# Patient Record
Sex: Male | Born: 1970 | Race: White | Hispanic: No | Marital: Married | Smoking: Never smoker
Health system: Southern US, Community
[De-identification: ages and names within clinical notes are randomized; demographics above are authoritative.]

## PROBLEM LIST (undated history)

## (undated) DIAGNOSIS — I10 Essential (primary) hypertension: Secondary | ICD-10-CM

## (undated) DIAGNOSIS — R002 Palpitations: Secondary | ICD-10-CM

## (undated) DIAGNOSIS — Z87442 Personal history of urinary calculi: Secondary | ICD-10-CM

## (undated) HISTORY — DX: Essential (primary) hypertension: I10

## (undated) HISTORY — DX: Personal history of urinary calculi: Z87.442

## (undated) HISTORY — PX: KIDNEY STONE SURGERY: SHX686

## (undated) HISTORY — DX: Palpitations: R00.2

## (undated) HISTORY — PX: COLONOSCOPY: SHX174

---

## 2004-01-12 ENCOUNTER — Ambulatory Visit (HOSPITAL_COMMUNITY): Admission: RE | Admit: 2004-01-12 | Discharge: 2004-01-12 | Payer: Self-pay | Admitting: Internal Medicine

## 2004-01-17 ENCOUNTER — Ambulatory Visit (HOSPITAL_COMMUNITY): Admission: RE | Admit: 2004-01-17 | Discharge: 2004-01-17 | Payer: Self-pay | Admitting: Internal Medicine

## 2004-01-23 ENCOUNTER — Ambulatory Visit (HOSPITAL_COMMUNITY): Admission: RE | Admit: 2004-01-23 | Discharge: 2004-01-23 | Payer: Self-pay | Admitting: Urology

## 2004-11-23 ENCOUNTER — Emergency Department (HOSPITAL_COMMUNITY): Admission: EM | Admit: 2004-11-23 | Discharge: 2004-11-23 | Payer: Self-pay | Admitting: Family Medicine

## 2005-06-11 ENCOUNTER — Emergency Department (HOSPITAL_COMMUNITY): Admission: EM | Admit: 2005-06-11 | Discharge: 2005-06-11 | Payer: Self-pay | Admitting: Family Medicine

## 2005-06-11 ENCOUNTER — Ambulatory Visit (HOSPITAL_COMMUNITY): Admission: RE | Admit: 2005-06-11 | Discharge: 2005-06-11 | Payer: Self-pay | Admitting: Family Medicine

## 2006-01-14 ENCOUNTER — Emergency Department (HOSPITAL_COMMUNITY): Admission: EM | Admit: 2006-01-14 | Discharge: 2006-01-14 | Payer: Self-pay | Admitting: Family Medicine

## 2008-07-02 ENCOUNTER — Inpatient Hospital Stay (HOSPITAL_COMMUNITY): Admission: EM | Admit: 2008-07-02 | Discharge: 2008-07-07 | Payer: Self-pay | Admitting: Emergency Medicine

## 2008-07-11 ENCOUNTER — Encounter: Admission: RE | Admit: 2008-07-11 | Discharge: 2008-07-11 | Payer: Self-pay | Admitting: Surgery

## 2010-06-27 LAB — URINALYSIS, ROUTINE W REFLEX MICROSCOPIC
Bilirubin Urine: NEGATIVE
Glucose, UA: NEGATIVE mg/dL
Ketones, ur: NEGATIVE mg/dL
Leukocytes, UA: NEGATIVE
Nitrite: NEGATIVE
Protein, ur: 100 mg/dL — AB
Specific Gravity, Urine: 1.024 (ref 1.005–1.030)
Urobilinogen, UA: 1 mg/dL (ref 0.0–1.0)
pH: 6 (ref 5.0–8.0)

## 2010-06-27 LAB — CBC
HCT: 38.8 % — ABNORMAL LOW (ref 39.0–52.0)
HCT: 39 % (ref 39.0–52.0)
HCT: 40.2 % (ref 39.0–52.0)
HCT: 41.1 % (ref 39.0–52.0)
HCT: 44 % (ref 39.0–52.0)
Hemoglobin: 13.4 g/dL (ref 13.0–17.0)
Hemoglobin: 13.5 g/dL (ref 13.0–17.0)
Hemoglobin: 13.8 g/dL (ref 13.0–17.0)
Hemoglobin: 14.3 g/dL (ref 13.0–17.0)
Hemoglobin: 15 g/dL (ref 13.0–17.0)
MCHC: 34.1 g/dL (ref 30.0–36.0)
MCHC: 34.2 g/dL (ref 30.0–36.0)
MCHC: 34.3 g/dL (ref 30.0–36.0)
MCHC: 34.7 g/dL (ref 30.0–36.0)
MCHC: 34.7 g/dL (ref 30.0–36.0)
MCV: 89.9 fL (ref 78.0–100.0)
MCV: 90.2 fL (ref 78.0–100.0)
MCV: 90.9 fL (ref 78.0–100.0)
MCV: 91.3 fL (ref 78.0–100.0)
MCV: 91.7 fL (ref 78.0–100.0)
Platelets: 128 10*3/uL — ABNORMAL LOW (ref 150–400)
Platelets: 155 10*3/uL (ref 150–400)
Platelets: 166 10*3/uL (ref 150–400)
Platelets: 192 10*3/uL (ref 150–400)
Platelets: 193 10*3/uL (ref 150–400)
RBC: 4.26 MIL/uL (ref 4.22–5.81)
RBC: 4.26 MIL/uL (ref 4.22–5.81)
RBC: 4.46 MIL/uL (ref 4.22–5.81)
RBC: 4.57 MIL/uL (ref 4.22–5.81)
RBC: 4.82 MIL/uL (ref 4.22–5.81)
RDW: 12.9 % (ref 11.5–15.5)
RDW: 12.9 % (ref 11.5–15.5)
RDW: 13 % (ref 11.5–15.5)
RDW: 13.1 % (ref 11.5–15.5)
RDW: 13.2 % (ref 11.5–15.5)
WBC: 11.6 10*3/uL — ABNORMAL HIGH (ref 4.0–10.5)
WBC: 14.5 10*3/uL — ABNORMAL HIGH (ref 4.0–10.5)
WBC: 8.6 10*3/uL (ref 4.0–10.5)
WBC: 9 10*3/uL (ref 4.0–10.5)
WBC: 9.4 10*3/uL (ref 4.0–10.5)

## 2010-06-27 LAB — DIFFERENTIAL
Basophils Absolute: 0 10*3/uL (ref 0.0–0.1)
Basophils Relative: 0 % (ref 0–1)
Eosinophils Absolute: 0.1 10*3/uL (ref 0.0–0.7)
Eosinophils Relative: 1 % (ref 0–5)
Lymphocytes Relative: 11 % — ABNORMAL LOW (ref 12–46)
Lymphs Abs: 1.5 10*3/uL (ref 0.7–4.0)
Monocytes Absolute: 1.5 10*3/uL — ABNORMAL HIGH (ref 0.1–1.0)
Monocytes Relative: 10 % (ref 3–12)
Neutro Abs: 11.3 10*3/uL — ABNORMAL HIGH (ref 1.7–7.7)
Neutrophils Relative %: 78 % — ABNORMAL HIGH (ref 43–77)

## 2010-06-27 LAB — URINE MICROSCOPIC-ADD ON

## 2010-06-27 LAB — COMPREHENSIVE METABOLIC PANEL
ALT: 32 U/L (ref 0–53)
AST: 29 U/L (ref 0–37)
Albumin: 3.8 g/dL (ref 3.5–5.2)
Alkaline Phosphatase: 71 U/L (ref 39–117)
BUN: 11 mg/dL (ref 6–23)
CO2: 24 mEq/L (ref 19–32)
Calcium: 8.8 mg/dL (ref 8.4–10.5)
Chloride: 104 mEq/L (ref 96–112)
Creatinine, Ser: 1.01 mg/dL (ref 0.4–1.5)
GFR calc Af Amer: >60 mL/min (ref >60)
GFR calc non Af Amer: >60 mL/min (ref >60)
Glucose, Bld: 108 mg/dL — ABNORMAL HIGH (ref 70–99)
Potassium: 3.5 mEq/L (ref 3.5–5.1)
Sodium: 135 mEq/L (ref 135–145)
Total Bilirubin: 2 mg/dL — ABNORMAL HIGH (ref 0.3–1.2)
Total Protein: 7.4 g/dL (ref 6.0–8.3)

## 2010-06-27 LAB — LIPASE, BLOOD: Lipase: 24 U/L (ref 11–59)

## 2010-07-31 NOTE — Discharge Summary (Signed)
[name], [name] NO.:  [id number]   MEDICAL RECORD NO.:  [id number]          PATIENT TYPE:  INP   LOCATION:  5157                         FACILITY:  [facility]   PHYSICIAN:  Ardeth Sportsman, MD     DATE OF BIRTH:  1970/10/18   DATE OF ADMISSION:  07/02/2008  DATE OF DISCHARGE:  07/07/2008                               DISCHARGE SUMMARY   CHIEF COMPLAINT AND REASON FOR ADMISSION:  Mr. [name] is a 40 year old  male patient who developed abdominal pain on Wednesday prior to  presentation presented with fevers and pain that was only focalized to  the right lower quadrant.  He had fever to 102 and a white count of  19,500.  I initially felt the patient may have acute appendicitis but  the CT scan revealed diverticulitis with inflammatory phlegmon and  microperforation of the cecal area with air focally without any  extravasation of CT contrast.  He was evaluated by Dr. Freida Busman in the ER.  The CT was reviewed and it was felt the patient had acute diverticulitis  without focal abscess collection, so plans were to admit the patient to  the hospital.   ADMITTING DIAGNOSIS:  Abdominal pain and leukocytosis secondary to acute  diverticulitis with associated microperforation.   HOSPITAL COURSE:  The patient was admitted, placed on bowel rest, IV  fluids, empiric Cipro and Flagyl and medications for symptom management.  His pain and symptoms were monitored serially as well as were his labs.  He eventually defervesced and his white count normalized by hospital day  #2.  He was started on clear liquids.  He remained on IV antibiotics.  His diet was advanced up to pureed diet by hospital day #3, but he began  having mild increase in pain more band-like all over associated more so  with eating but still present even without eating.  CT was checked  basically was stable showed the persistent phlegmon in the right lower  quadrant, slightly larger air collection but again no  extravasation of  CT contrast into this air collection or into the pelvis.  Subsequently,  the patient's diet was advanced to low residue.  He was switched over to  oral pain medications.  His pain was managed in addition with IV  Toradol.  By date of discharge, his white count remained normal at 9400  with a hemoglobin of 13.8, vital signs were stable.  He was having some  problems with mild hypertension during this hospitalization but was also  having pain and therefore I recommended once he is used to the initial  acute phase of this process follow up with his primary care physician  regarding evaluation for a new diagnosis of hypertension.  Getting back  to his abdominal problems on clinical exam on date of discharge his  abdomen was soft and nontender including in the right lower quadrant.  Bowel sounds were present.  He was tolerating low-residue diet with use  of oral Percocet and IV Toradol for managing pain.  The patient plans on  using p.r.n. ibuprofen at home.  He  has had some reflux while here.  He  will take Zantac.  He will take ibuprofen with food as he does need to  take this medication and has also been instructed he may need to add  Prilosec to that regimen and if he still has symptoms, he is to stop  attempting to use ibuprofen.   FINAL DISCHARGE DIAGNOSES:  1. Abdominal pain and leukocytosis secondary to acute perforate      diverticulitis.  No abscess, phlegmon only.  2. Borderline hypertension.  3. Gastroesophageal reflux disease.   DISCHARGE MEDICATIONS:  The patient will resume the following home  medications.  1. Claritin as needed.  2. Over-the-counter Zantac as needed.   NEW MEDICATIONS:  1. Percocet 5/325 1-2 tablets every 4 hours as needed for pain.  2. Cipro 500 mg b.i.d. for 7 days.  3. Flagyl 500 mg three times daily for 7 days.  4. Over-the-counter ibuprofen with food in addition to Percocet as      needed.  5. Over-the-counter Prilosec if you are  taking the ibuprofen.   OTHER RECOMMENDATIONS:  The patient is to return to work in 2 weeks.  A  note has been given.  He may return sooner if he is not taking narcotic  pain medications, otherwise feels up to returning to work.   FOLLOW UP:  He needs to see Dr. Corliss Skains in the office on July 15, 2008 at  9:30 a.m.  He is to arrive at 9:15.  He can discuss return to work  sooner than 2 weeks at this point.  In addition, I have discussed with  the patient he will probably need a screening colonoscopy and this will  probably be discussed in an additional followup with Dr. Corliss Skains.  Followup CT the patient had two CTs while in the hospital.  He was  afebrile and without pain and normal white count on date of discharge.  Defer any additional CTs to Dr. Fatima Sanger discretion.   OTHER ACTIVITY:  Essentially no restrictions on activity other than no  driving for 1 week while taking narcotics.   OTHER INSTRUCTIONS:  He is to call the surgeon if a fever greater than  or equal to 101.5 degrees Fahrenheit be new or increased belly pain.   DIET:  Again he is to be on a low-residue diet for 2 weeks and he may  resume a high fiber diet specific to diverticular disease.   Expect the patient will probably undergo segmental elective colectomy in  the next 6-8 weeks per Dr. Fatima Sanger discretion and recommendations.      Gabriel L. Eliott Nine, MD  Electronically Signed    ALE/MEDQ  D:  07/07/2008  T:  07/07/2008  Job:  [id number]   cc:   Wilmon Arms. Tsuei, M.D.

## 2010-07-31 NOTE — H&P (Signed)
NAMEROYALTY, DOMAGALA                 ACCOUNT NO.:  [id number]   MEDICAL RECORD NO.:  [id number]          PATIENT TYPE:  INP   LOCATION:  5120                         FACILITY:  [facility]   PHYSICIAN:  Lennie Muckle, MD      DATE OF BIRTH:  April 15, 1970   DATE OF ADMISSION:  07/02/2008  DATE OF DISCHARGE:                              HISTORY & PHYSICAL   DIAGNOSIS:  Diverticulitis.   CHIEF COMPLAINT:  Abdominal pain.   HISTORY OF PRESENT ILLNESS:  Mr. Trim is a 40 year old male who had  onset of abdominal pain on Wednesday.  He said it was a dull aching  pain.  It was located in the right lower quadrant.  He had had some mild  nausea and anorexia.  No frank emesis.  He did have fevers to 102.  The  pain then worsened, today it is worse.  It is worse with movement.  He  complained of pain on the car ride over.  No diarrhea.  He has  constipation.  No previous history of abdominal pain.  He has a white  count of 19.5.  He does have a CT scan revealing diverticulitis,  inflammation, and microperforation around the cecal area.  He feels  somewhat better after receiving narcotics.   PAST MEDICAL HISTORY:  He does have a history of hypertension.   SURGICAL HISTORY:  Negative.   FAMILY HISTORY:  Negative.   SOCIAL HISTORY:  Negative.   ALLERGIES:  No allergies.   MEDICATIONS:  No current medications.   He does work.  He is a Engineer, agricultural.   REVIEW OF SYSTEMS:  Other than the hypertension, there is no significant  medical history.   PHYSICAL EXAMINATION:  VITAL SIGNS:  His T-max was 101 at emergency  department, current 98.5, pulse 91, and blood pressure 147/105.  GENERAL:  Overall, he is lying on the stretcher in no acute distress  HEENT:  Normocephalic.  Sclerae are clear.  CHEST:  Clear to auscultation bilaterally.  CARDIOVASCULAR:  Regular rate and rhythm.  ABDOMEN:  Mildly distended.  He is tender in the right lower quadrant.  MUSCULOSKELETAL:  No deformities or edema seen.  SKIN:  No rashes are seen.   CT is reviewed.  There are some inflammatory changes around the cecal  and small pockets of air.   IMPRESSION AND PLAN:  Diverticulitis of the cecal, admit, intravenous  antibiotics, n.p.o., bowel rest, continue to monitor for any significant  change.  I have talked to him about hopefully electively performing  letting his inflammation resolve, perform by endoscopy for further  evaluation in approximately 6 weeks; however, if he does change  significantly would require emergent resection.  All questions were  answered.      Lennie Muckle, MD  Electronically Signed     ALA/MEDQ  D:  07/02/2008  T:  07/03/2008  Job:  [id number]

## 2010-09-28 ENCOUNTER — Ambulatory Visit: Payer: Self-pay

## 2010-09-28 ENCOUNTER — Other Ambulatory Visit: Payer: Self-pay | Admitting: Occupational Medicine

## 2010-09-28 DIAGNOSIS — R52 Pain, unspecified: Secondary | ICD-10-CM

## 2012-02-24 IMAGING — CR DG KNEE COMPLETE 4+V*R*
4 series · 4 of 4 positions shown · non-contrast
Comparison: 06/11/2005.

CLINICAL DATA: 39-year-old male with posterior right knee joint
pain.  Hyperextension injury.

RIGHT KNEE - COMPLETE 4+ VIEW

[view not recorded (1 of 4)]
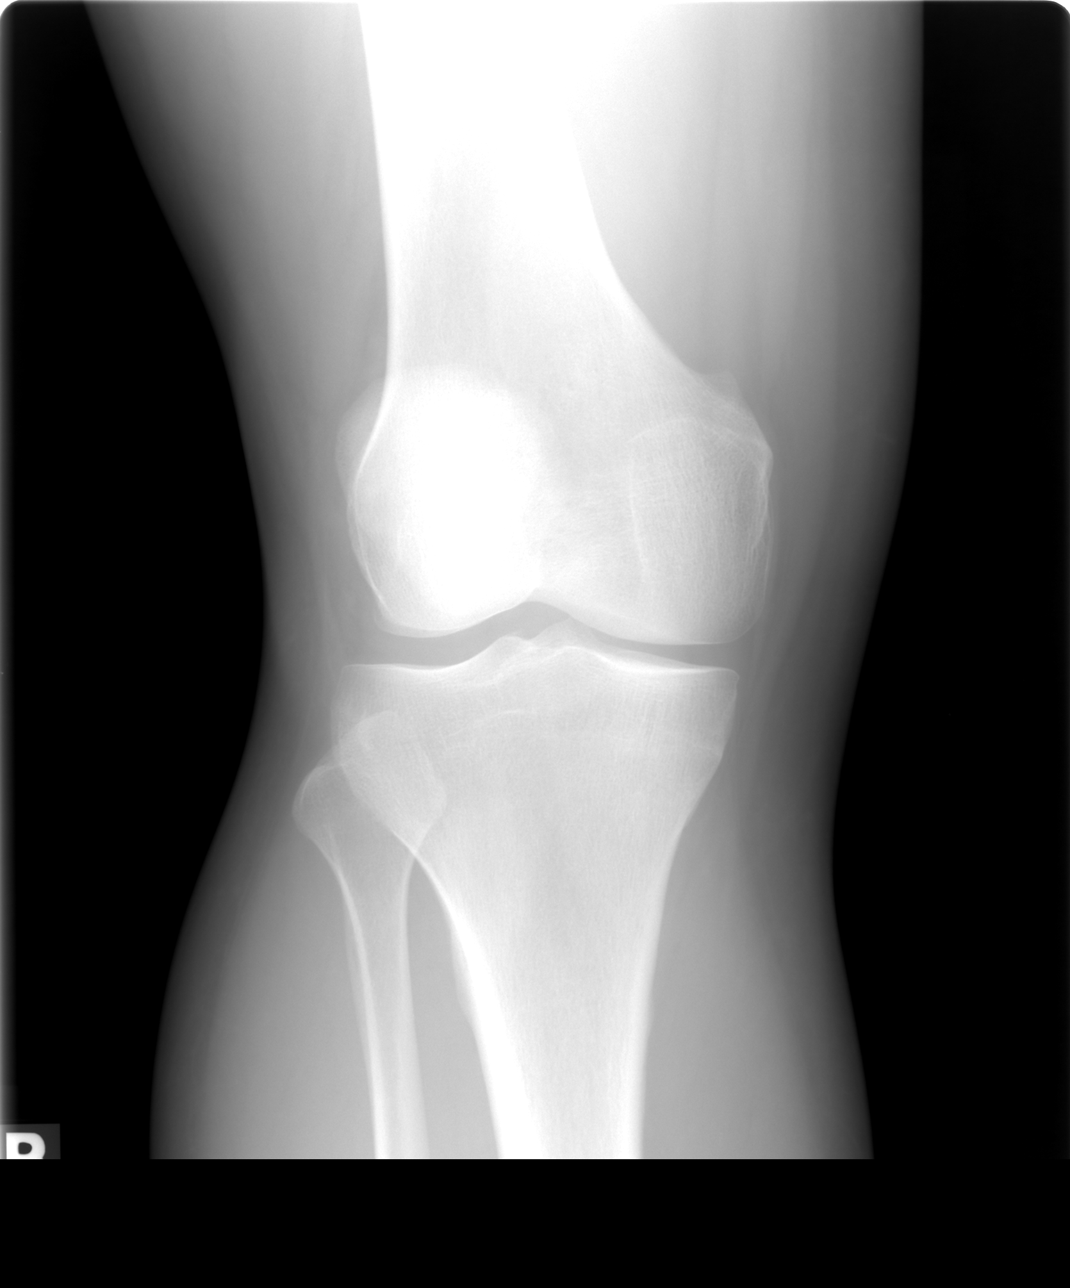

[view not recorded (2 of 4)]
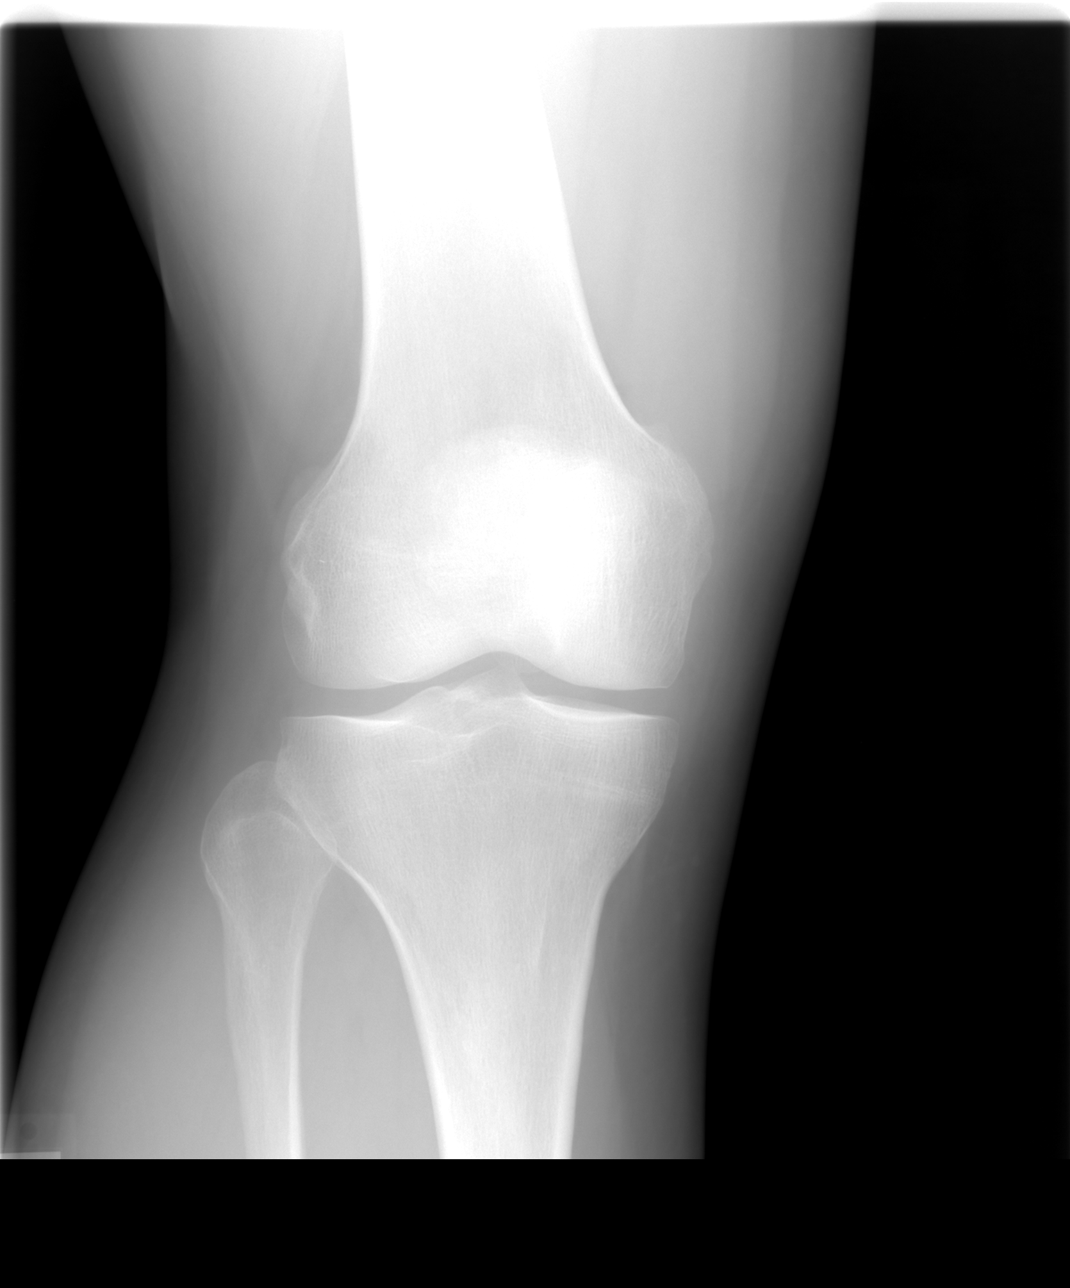

[view not recorded (3 of 4)]
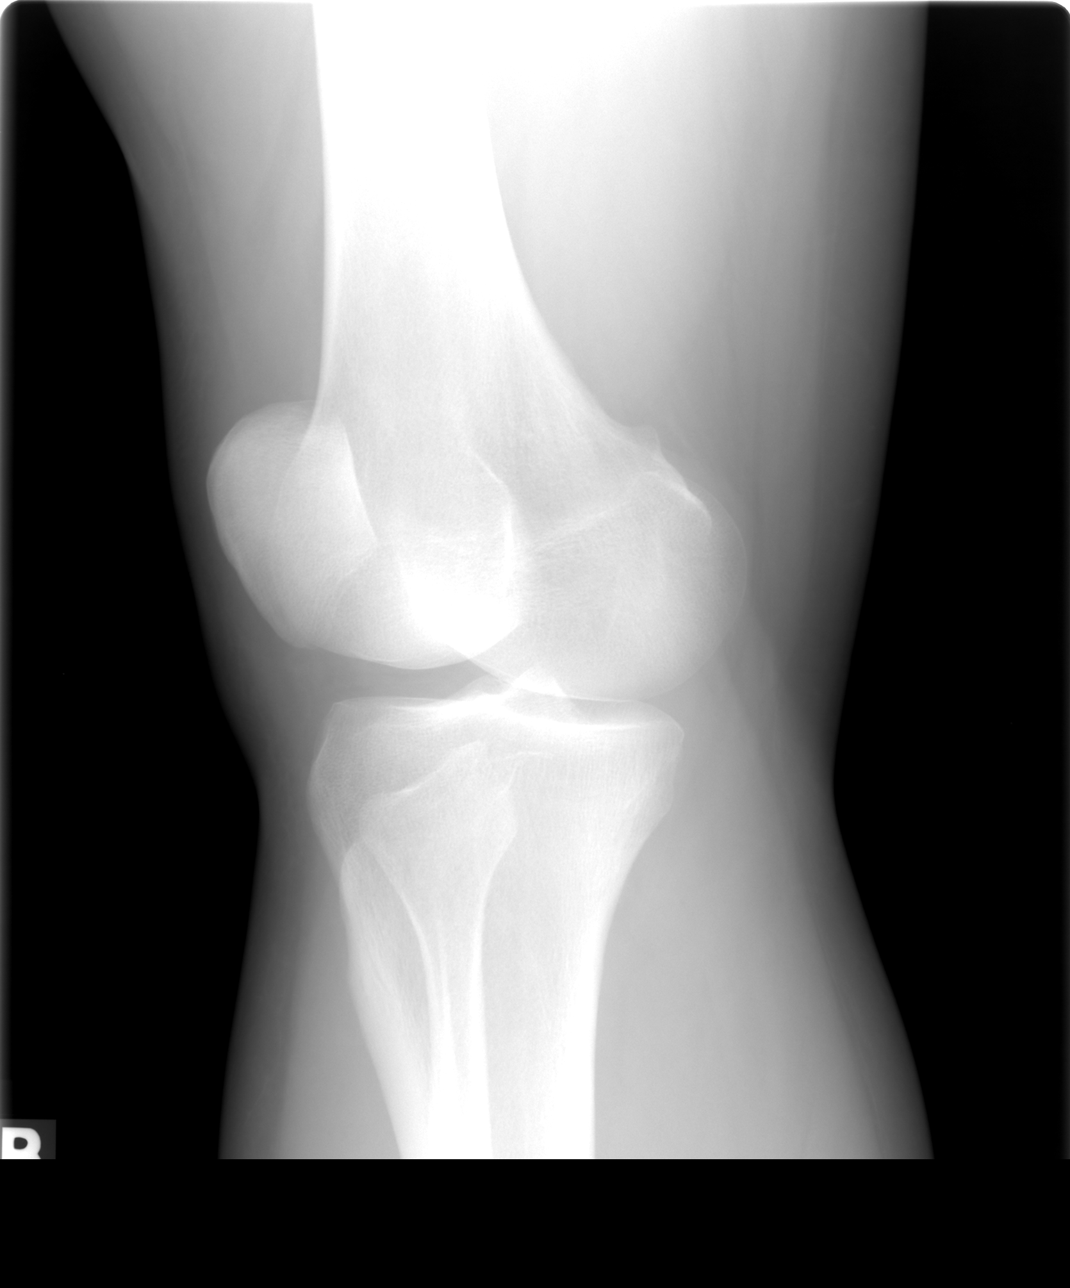

[view not recorded (4 of 4)]
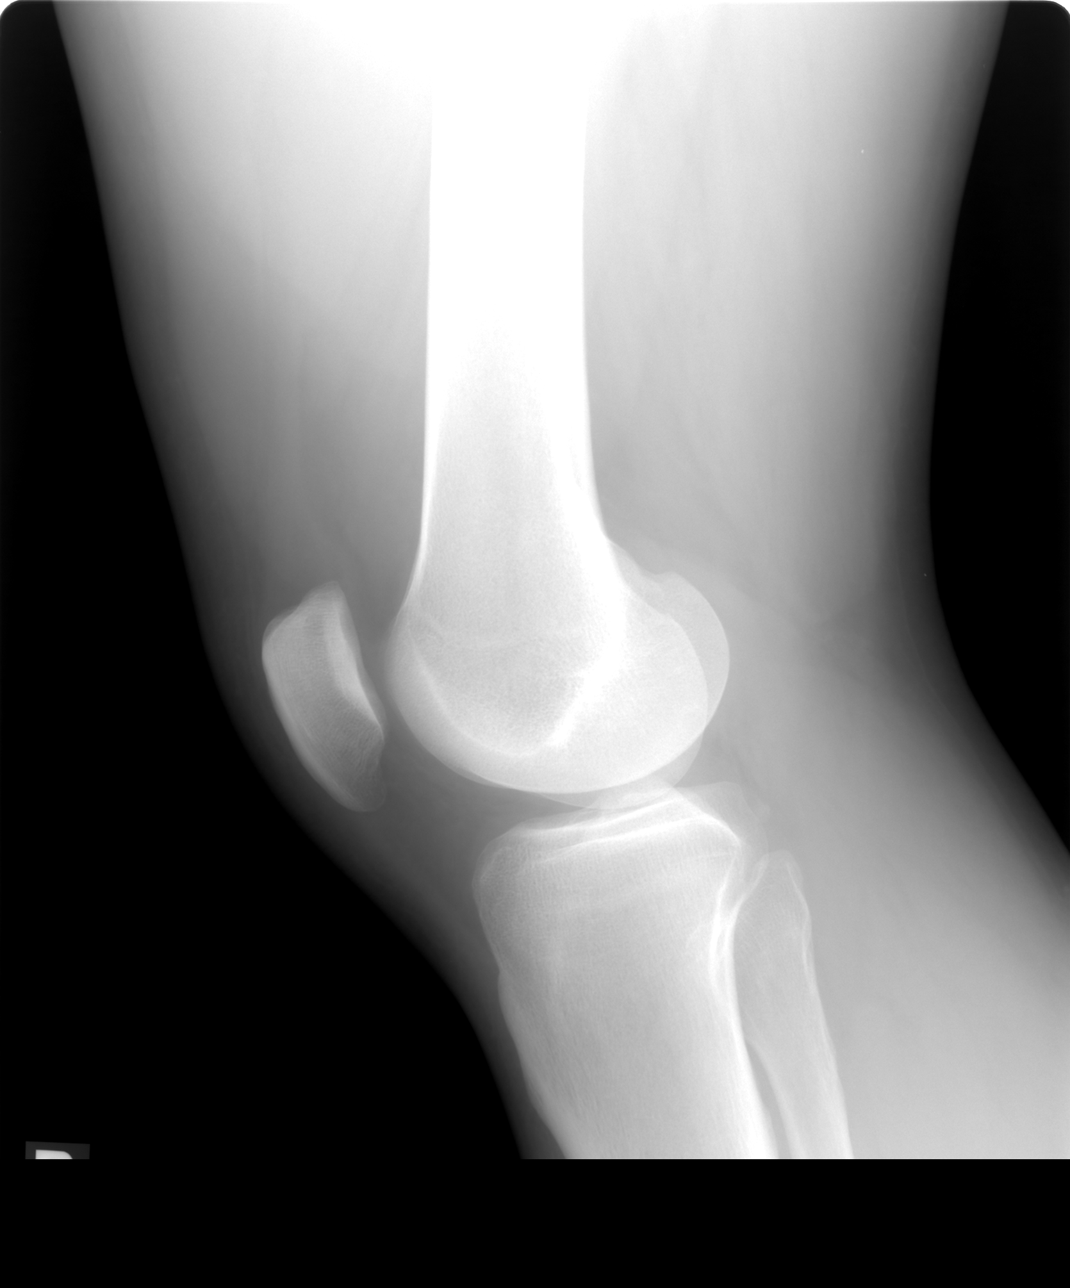

[4 of 4 positions shown; findings below may reference images not displayed]

FINDINGS: Probable trace joint effusion.  Normal bone
mineralization and joint spaces.  No acute fracture.
IMPRESSION: Suspect small joint effusion.  No acute fracture or dislocation
identified.

## 2016-07-31 DIAGNOSIS — R0781 Pleurodynia: Secondary | ICD-10-CM | POA: Diagnosis not present

## 2017-04-07 DIAGNOSIS — I10 Essential (primary) hypertension: Secondary | ICD-10-CM | POA: Diagnosis not present

## 2017-06-16 DIAGNOSIS — Z0000 Encounter for general adult medical examination without abnormal findings: Secondary | ICD-10-CM | POA: Diagnosis not present

## 2017-06-16 DIAGNOSIS — I10 Essential (primary) hypertension: Secondary | ICD-10-CM | POA: Diagnosis not present

## 2017-06-19 DIAGNOSIS — H109 Unspecified conjunctivitis: Secondary | ICD-10-CM | POA: Diagnosis not present

## 2018-01-27 DIAGNOSIS — I10 Essential (primary) hypertension: Secondary | ICD-10-CM | POA: Diagnosis not present

## 2018-01-27 DIAGNOSIS — Z23 Encounter for immunization: Secondary | ICD-10-CM | POA: Diagnosis not present

## 2018-01-27 DIAGNOSIS — E669 Obesity, unspecified: Secondary | ICD-10-CM | POA: Diagnosis not present

## 2019-07-27 ENCOUNTER — Ambulatory Visit: Payer: Self-pay | Attending: Internal Medicine

## 2019-07-27 DIAGNOSIS — Z23 Encounter for immunization: Secondary | ICD-10-CM

## 2019-07-27 NOTE — Progress Notes (Signed)
   Covid-19 Vaccination Clinic  Name:  [name]    MRN: [id number] DOB: Mar 16, 1971  07/27/2019  [name] was observed post Covid-19 immunization for 15 minutes without incident. He was provided with Vaccine Information Sheet and instruction to access the V-Safe system.   [name] was instructed to call 911 with any severe reactions post vaccine: Marland Kitchen Difficulty breathing  . Swelling of face and throat  . A fast heartbeat  . A bad rash all over body  . Dizziness and weakness   Immunizations Administered    Name Date Dose VIS Date Route   Pfizer COVID-19 Vaccine 07/27/2019  8:27 AM 0.3 mL 05/12/2018 Intramuscular   Manufacturer: [employer], [employer]   Lot: [id number]   NDC: [address]-8

## 2019-08-17 ENCOUNTER — Ambulatory Visit: Payer: Self-pay | Attending: Internal Medicine

## 2019-08-17 DIAGNOSIS — Z23 Encounter for immunization: Secondary | ICD-10-CM

## 2019-08-17 NOTE — Progress Notes (Signed)
   Covid-19 Vaccination Clinic  Name:  ISMAEL KARGE    MRN: [id number] DOB: 1970/09/29  08/17/2019  Mr. Cheetham was observed post Covid-19 immunization for 15 minutes without incident. He was provided with Vaccine Information Sheet and instruction to access the V-Safe system.   Mr. Pavich was instructed to call 911 with any severe reactions post vaccine: Marland Kitchen Difficulty breathing  . Swelling of face and throat  . A fast heartbeat  . A bad rash all over body  . Dizziness and weakness   Immunizations Administered    Name Date Dose VIS Date Route   Pfizer COVID-19 Vaccine 08/17/2019  9:13 AM 0.3 mL 05/12/2018 Intramuscular   Manufacturer: [employer], [employer]   Lot: [id number]   NDC: [address]-2

## 2022-06-21 ENCOUNTER — Ambulatory Visit
Admission: RE | Admit: 2022-06-21 | Discharge: 2022-06-21 | Disposition: A | Discharge status: Home / Self Care | Payer: 59 | Source: Ambulatory Visit | Attending: Physician Assistant | Admitting: Physician Assistant

## 2022-06-21 ENCOUNTER — Other Ambulatory Visit: Payer: Self-pay | Admitting: Physician Assistant

## 2022-06-21 DIAGNOSIS — L03211 Cellulitis of face: Secondary | ICD-10-CM

## 2022-06-21 MED ORDER — IOPAMIDOL (ISOVUE-300) INJECTION 61%
75.0000 mL | Freq: Once | INTRAVENOUS | Status: AC | PRN
  Administered 2022-06-21: 75 mL via INTRAVENOUS

## 2022-07-19 ENCOUNTER — Encounter (HOSPITAL_BASED_OUTPATIENT_CLINIC_OR_DEPARTMENT_OTHER): Payer: Self-pay

## 2022-07-19 ENCOUNTER — Other Ambulatory Visit: Payer: Self-pay

## 2022-07-19 ENCOUNTER — Emergency Department (HOSPITAL_BASED_OUTPATIENT_CLINIC_OR_DEPARTMENT_OTHER)
Admission: EM | Admit: 2022-07-19 | Discharge: 2022-07-19 | Disposition: A | Discharge status: Home / Self Care | Payer: 59 | Attending: Emergency Medicine | Admitting: Emergency Medicine

## 2022-07-19 ENCOUNTER — Emergency Department (HOSPITAL_BASED_OUTPATIENT_CLINIC_OR_DEPARTMENT_OTHER): Payer: 59 | Admitting: Radiology

## 2022-07-19 DIAGNOSIS — R002 Palpitations: Secondary | ICD-10-CM | POA: Insufficient documentation

## 2022-07-19 LAB — CBC
HCT: 47.9 % (ref 39.0–52.0)
Hemoglobin: 16.4 g/dL (ref 13.0–17.0)
MCH: 30.4 pg (ref 26.0–34.0)
MCHC: 34.2 g/dL (ref 30.0–36.0)
MCV: 88.7 fL (ref 80.0–100.0)
Platelets: 208 10*3/uL (ref 150–400)
RBC: 5.4 MIL/uL (ref 4.22–5.81)
RDW: 13 % (ref 11.5–15.5)
WBC: 7.3 10*3/uL (ref 4.0–10.5)
nRBC: 0 % (ref 0.0–0.2)

## 2022-07-19 LAB — BASIC METABOLIC PANEL
Anion gap: 12 (ref 5–15)
BUN: 19 mg/dL (ref 6–20)
CO2: 27 mmol/L (ref 22–32)
Calcium: 10 mg/dL (ref 8.9–10.3)
Chloride: 104 mmol/L (ref 98–111)
Creatinine, Ser: 1 mg/dL (ref 0.61–1.24)
GFR, Estimated: >60 mL/min (ref >60)
Glucose, Bld: 103 mg/dL — ABNORMAL HIGH (ref 70–99)
Potassium: 4 mmol/L (ref 3.5–5.1)
Sodium: 143 mmol/L (ref 135–145)

## 2022-07-19 LAB — TSH: TSH: 0.509 u[IU]/mL (ref 0.350–4.500)

## 2022-07-19 LAB — TROPONIN I (HIGH SENSITIVITY)
Troponin I (High Sensitivity): 3 ng/L (ref <18)
Troponin I (High Sensitivity): 3 ng/L (ref <18)

## 2022-07-19 NOTE — ED Triage Notes (Signed)
Patient arrives with complaints of intermittent palpitations x3 days and having an Abnormal EKG. He was seen at the [facility] and referred here for further evaluation.  Patient states that he does have chest soreness as well.

## 2022-07-19 NOTE — Discharge Instructions (Signed)
Your evaluation today did not show any concerning finding.  The cardiology office will reach out to you to set up an outpatient follow-up for further assessment.  Return to ER if you have any concern.

## 2022-07-19 NOTE — ED Provider Notes (Signed)
[place] EMERGENCY DEPARTMENT AT [facility] Provider Note   CSN: [id number] Arrival date & time: 07/19/22  1758     History  Chief Complaint  Patient presents with   Palpitations   Tachycardia    [name] is a 52 y.o. male.  The history is provided by the patient and medical records. No language interpreter was used.  Palpitations    52 year old male presented to ED for evaluation of heart palpitation.  Patient reports she has been experiencing intermittent episodes of heart palpitation for the past week.  Described as heart racing sometimes waking up from sleep which concerns him.  States that he has been monitoring his diet very carefully, have been losing weight due to good dietary intake, he has been exercise daily walk at least 2 or 3 miles a day and denies any associated exertional chest pain or shortness of breath.  He does endorse strong family history of heart problems and family history of A-fib.  Patient denies alcohol or tobacco use and states that he is compliant with his medication at home.  He denies any prior history of PE or DVT no recent surgery prolonged bedrest active cancer hemoptysis leg swelling or calf pain.  He does not take an oral birth control pill.  Today he did experiencing some heart palpitation again and went to his PCP to be evaluated.  They noted that his EKG is slightly abnormal and recommend patient come to the ER for further assessment.    Home Medications Prior to Admission medications   Not on File      Allergies    Hydromorphone    Review of Systems   Review of Systems  Cardiovascular:  Positive for palpitations.  All other systems reviewed and are negative.   Physical Exam Updated Vital Signs BP (!) 149/111   Pulse 85   Temp 97.9 F (36.6 C) (Oral)   Resp 13   Ht 5\' 6"  (1.676 m)   Wt 104.8 kg   SpO2 99%   BMI 37.28 kg/m  Physical Exam Vitals and nursing note reviewed.  Constitutional:      General: He is  not in acute distress.    Appearance: He is well-developed.  HENT:     Head: Atraumatic.  Eyes:     Conjunctiva/sclera: Conjunctivae normal.  Neck:     Comments: No thyromegaly Cardiovascular:     Rate and Rhythm: Normal rate and regular rhythm.     Pulses: Normal pulses.     Heart sounds: Normal heart sounds.  Pulmonary:     Effort: Pulmonary effort is normal.     Breath sounds: Normal breath sounds. No wheezing, rhonchi or rales.  Abdominal:     Palpations: Abdomen is soft.     Tenderness: There is no abdominal tenderness.  Musculoskeletal:     Cervical back: Neck supple.     Right lower leg: No edema.     Left lower leg: No edema.  Skin:    Findings: No rash.  Neurological:     Mental Status: He is alert.     ED Results / Procedures / Treatments   Labs (all labs ordered are listed, but only abnormal results are displayed) Labs Reviewed  BASIC METABOLIC PANEL - Abnormal; Notable for the following components:      Result Value   Glucose, Bld 103 (*)    All other components within normal limits  CBC  TSH  TROPONIN I (HIGH SENSITIVITY)  TROPONIN I (  HIGH SENSITIVITY)    EKG EKG Interpretation  Date/Time:  Friday Jul 19 2022 18:51:13 EDT Ventricular Rate:  96 PR Interval:  160 QRS Duration: 80 QT Interval:  338 QTC Calculation: 427 R Axis:   12 Text Interpretation: Normal sinus rhythm Possible Left atrial enlargement Possible Anterior infarct , age undetermined Abnormal ECG No old tracing to compare Confirmed by Melene Plan [phone]) on 07/19/2022 8:46:56 PM  Radiology DG Chest 2 View  Result Date: 07/19/2022 CLINICAL DATA:  Palpitations EXAM: CHEST - 2 VIEW COMPARISON:  None Available. FINDINGS: The heart size and mediastinal contours are within normal limits. Both lungs are clear. The visualized skeletal structures are unremarkable. IMPRESSION: No active cardiopulmonary disease. Electronically Signed   By: Helyn Numbers M.D.   On: 07/19/2022 19:34     Procedures Procedures    Medications Ordered in ED Medications - No data to display  ED Course/ Medical Decision Making/ A&P                             Medical Decision Making Amount and/or Complexity of Data Reviewed Labs: ordered. Radiology: ordered.   BP (!) 149/111   Pulse 85   Temp 97.9 F (36.6 C) (Oral)   Resp 13   Ht 5\' 6"  (1.676 m)   Wt 104.8 kg   SpO2 99%   BMI 37.28 kg/m   38:49 PM  52 year old male presented to ED for evaluation of heart palpitation.  Patient reports she has been experiencing intermittent episodes of heart palpitation for the past week.  Described as heart racing sometimes waking up from sleep which concerns him.  States that he has been monitoring his diet very carefully, have been losing weight due to good dietary intake, he has been exercise daily walk at least 2 or 3 miles a day and denies any associated exertional chest pain or shortness of breath.  He does endorse strong family history of heart problems and family history of A-fib.  Patient denies alcohol or tobacco use and states that he is compliant with his medication at home.  He denies any prior history of PE or DVT no recent surgery prolonged bedrest active cancer hemoptysis leg swelling or calf pain.  He does not take an oral birth control pill.  Today he did experiencing some heart palpitation again and went to his PCP to be evaluated.  They noted that his EKG is slightly abnormal and recommend patient come to the ER for further assessment.  On exam this is a well-appearing male resting comfortably appears to be in no acute discomfort heart with normal rate and rhythm lungs are clear to auscultation bilaterally abdomen is soft nontender, neck exam unremarkable no evidence of thyromegaly.  Patient does not have any signs of peripheral edema.  He has intact distal pulses throughout.  -Labs ordered, independently viewed and interpreted by me.  Labs remarkable for negative delta troponin,  low suspicion for ACS.  Patient has a heart score of 2, low risk of Mace.  Normal TSH, doubt thyroid illness.  Labs overall reassuring no evidence of anemia normal WBC, normal H&H, normal electrolyte panel. -The patient was maintained on a cardiac monitor.  I personally viewed and interpreted the cardiac monitored which showed an underlying rhythm of: Normal sinus rhythm -Imaging independently viewed and interpreted by me and I agree with radiologist's interpretation.  Result remarkable for chest x-ray without any concerning finding -This patient presents to the ED  for concern of heart palpitation, this involves an extensive number of treatment options, and is a complaint that carries with it a high risk of complications and morbidity.  The differential diagnosis includes thyroid disease, anemia, electrolyte imbalance, dehydration, cardiac arrhythmia, PE -Co morbidities that complicate the patient evaluation includes none -Treatment includes close monitoring -Reevaluation of the patient after these medicines showed that the patient improved -PCP office notes or outside notes reviewed -Escalation to admission/observation considered: patients feels much better, is comfortable with discharge, and will follow up with PCP and with cardiology -Prescription medication considered, patient comfortable with over-the-counter medication -Social Determinant of Health considered  Will give patient referral to cardiology for outpatient evaluation.  He may benefit from cardiac monitoring to assess for his heart palpitation.         Final Clinical Impression(s) / ED Diagnoses Final diagnoses:  Heart palpitations    Rx / DC Orders ED Discharge Orders          Ordered    Ambulatory referral to Cardiology       Comments: If you have not heard from the Cardiology office within the next 72 hours please call [phone].   07/19/22 2317              Fayrene Helper, PA-C 07/19/22 2318    Melene Plan, DO 07/19/22 2333

## 2022-07-20 ENCOUNTER — Emergency Department (HOSPITAL_COMMUNITY): Payer: 59

## 2022-07-20 ENCOUNTER — Other Ambulatory Visit: Payer: Self-pay

## 2022-07-20 ENCOUNTER — Encounter (HOSPITAL_COMMUNITY): Payer: Self-pay

## 2022-07-20 ENCOUNTER — Emergency Department (HOSPITAL_COMMUNITY)
Admission: EM | Admit: 2022-07-20 | Discharge: 2022-07-20 | Disposition: A | Discharge status: Home / Self Care | Payer: 59 | Attending: Emergency Medicine | Admitting: Emergency Medicine

## 2022-07-20 DIAGNOSIS — R009 Unspecified abnormalities of heart beat: Secondary | ICD-10-CM | POA: Insufficient documentation

## 2022-07-20 DIAGNOSIS — R002 Palpitations: Secondary | ICD-10-CM | POA: Insufficient documentation

## 2022-07-20 DIAGNOSIS — R0602 Shortness of breath: Secondary | ICD-10-CM | POA: Insufficient documentation

## 2022-07-20 DIAGNOSIS — R000 Tachycardia, unspecified: Secondary | ICD-10-CM

## 2022-07-20 DIAGNOSIS — R17 Unspecified jaundice: Secondary | ICD-10-CM | POA: Diagnosis not present

## 2022-07-20 LAB — HEPATIC FUNCTION PANEL
ALT: 118 U/L — ABNORMAL HIGH (ref 0–44)
AST: 34 U/L (ref 15–41)
Albumin: 4.3 g/dL (ref 3.5–5.0)
Alkaline Phosphatase: 85 U/L (ref 38–126)
Bilirubin, Direct: 0.3 mg/dL — ABNORMAL HIGH (ref 0.0–0.2)
Indirect Bilirubin: 1.7 mg/dL — ABNORMAL HIGH (ref 0.3–0.9)
Total Bilirubin: 2 mg/dL — ABNORMAL HIGH (ref 0.3–1.2)
Total Protein: 7.4 g/dL (ref 6.5–8.1)

## 2022-07-20 LAB — BASIC METABOLIC PANEL
Anion gap: 15 (ref 5–15)
BUN: 18 mg/dL (ref 6–20)
CO2: 23 mmol/L (ref 22–32)
Calcium: 10 mg/dL (ref 8.9–10.3)
Chloride: 103 mmol/L (ref 98–111)
Creatinine, Ser: 1.11 mg/dL (ref 0.61–1.24)
GFR, Estimated: >60 mL/min (ref >60)
Glucose, Bld: 112 mg/dL — ABNORMAL HIGH (ref 70–99)
Potassium: 3.8 mmol/L (ref 3.5–5.1)
Sodium: 141 mmol/L (ref 135–145)

## 2022-07-20 LAB — CBC
HCT: 47 % (ref 39.0–52.0)
Hemoglobin: 16.6 g/dL (ref 13.0–17.0)
MCH: 30.8 pg (ref 26.0–34.0)
MCHC: 35.3 g/dL (ref 30.0–36.0)
MCV: 87.2 fL (ref 80.0–100.0)
Platelets: 227 10*3/uL (ref 150–400)
RBC: 5.39 MIL/uL (ref 4.22–5.81)
RDW: 12.8 % (ref 11.5–15.5)
WBC: 7.6 10*3/uL (ref 4.0–10.5)
nRBC: 0 % (ref 0.0–0.2)

## 2022-07-20 LAB — D-DIMER, QUANTITATIVE: D-Dimer, Quant: 0.28 ug/mL-FEU (ref 0.00–0.50)

## 2022-07-20 LAB — TROPONIN I (HIGH SENSITIVITY)
Troponin I (High Sensitivity): 4 ng/L (ref <18)
Troponin I (High Sensitivity): 4 ng/L (ref <18)

## 2022-07-20 NOTE — ED Triage Notes (Signed)
Pt came in via POV d/t the last few days his heart racing & feeling SOB. Pt reports his BP had been controlled until lately he was feeling very lightheaded & dizzy as well as nauseated. Was seen at [facility] clinic & was advised to be seen, he went to [facility] last night & came back today d/t exertional SOB causing sweating, lightheaded & some intermittent CP in the Lt side that radiates to his back intermittently. A/Ox4, rates pain 3/10 while in triage.

## 2022-07-20 NOTE — Discharge Instructions (Addendum)
Your workup today shows no significant abnormalities.  Your bilirubin was slightly elevated.  You will need to follow-up with your primary care doctor regarding these findings. There is no evidence that you have a heart attack, problems with your thyroid, a blood clot in your chest or any other life-threatening causes of the symptoms you experienced this morning. Is extremely important that you follow-up with cardiology to further assess the reason that you may be having these symptoms. Further workup may include things like a cardiac calcium CT, stress testing, or cardiac monitoring. If you have the symptoms again it is important to try and capture the rhythm on an EKG.  It may benefit you to call 911.  It does not necessarily mean that you have to come to the emergency department however EMS is able to do an EKG at your home and assess you. Regarding your blood pressure please only take your blood pressure once a day.  You should take this at a time that you feel relaxed and at least an hour after you have taken your daily blood pressure medication. It is more important that when you are having these abnormal symptoms you take your heart rate. Lastly you may discuss with your primary care doctor or whoever is prescribing your blood pressure medications a switch in medication from what you are currently taking to a beta-blocker.  This may help reduce the symptoms you are having of racing heart and palpitations.  You may also need further evaluation with your primary care doctor for more extensive thyroid testing although your initial screening labs are negative here.   SEEK IMMEDIATE MEDICAL ATTENTION IF: You have severe chest pain, especially if the pain is crushing or pressure-like and spreads to the arms, back, neck, or jaw, or if you have sweating, nausea (feeling sick to your stomach), or shortness of breath. THIS IS AN EMERGENCY. Don't wait to see if the pain will go away. Get medical help at once.  Call 911 or [phone] (operator). DO NOT drive yourself to the hospital.  Your chest pain gets worse and does not go away with rest.  You have an attack of chest pain lasting longer than usual, despite rest and treatment with the medications your caregiver has prescribed.  You wake from sleep with chest pain or shortness of breath.  You feel dizzy or faint.  You have chest pain not typical of your usual pain for which you originally saw your caregiver.

## 2022-07-20 NOTE — ED Provider Notes (Signed)
[place] EMERGENCY DEPARTMENT AT [facility] Provider Note   CSN: [id number] Arrival date & time: 07/20/22  1514     History  Chief Complaint  Patient presents with   Chest Pain   Shortness of Breath    [name] is a 52 y.o. male who presents emergency department chief complaint of racing heart.  Patient has been having racing heart and palpitations for the past 1 week.  He was seen yesterday in the emergency department at [facility] and had a workup that was negative for acute abnormality.  He has a past medical history of hypertension and hyperlipidemia.  Before the last month he was off of his blood pressure medications but has been vigilant about taking his lisinopril-hydrochlorothiazide daily and has had daily blood pressures that are well-controlled.  Patient also reports a 15 pound weight loss, and daily 2 to 3 mile walks without abnormality.  Over the past week he has had multiple episodes of noticing some exertional just dyspnea.  This is not constant but occurs intermittently.  This morning the patient went downstairs to take some food to his storage freezer and had onset of racing heart, shortness of breath.  Patient states he was gasping for air and became very diaphoretic.  He sat there for a minute or 2 and then tried to get back up to the first floor.  He took his blood pressure which was significantly elevated and his heart rate was about 130.  Patient states that he tried to calm down but decided to come to the emergency department for further evaluation.  He had taken his blood pressure medication prior to the onset of the symptoms.  He has a past family history of significant coronary artery disease requiring bypass grafts on his mother side.  He denies unilateral leg swelling history of pulmonary embolus.  Patient had a TSH within normal limits yesterday.  He does not use caffeine, drugs or alcohol.      Chest Pain Associated symptoms: shortness  of breath   Shortness of Breath Associated symptoms: chest pain        Home Medications Prior to Admission medications   Medication Sig Start Date End Date Taking? Authorizing Provider  lisinopril-hydrochlorothiazide (ZESTORETIC) 20-12.5 MG tablet Take 1 tablet by mouth daily.   Yes [provider]  Multiple Vitamins-Minerals (MULTIVITAMIN WITH MINERALS) tablet Take 1 tablet by mouth daily.   Yes [provider]      Allergies    Hydromorphone    Review of Systems   Review of Systems  Respiratory:  Positive for shortness of breath.   Cardiovascular:  Positive for chest pain.    Physical Exam Updated Vital Signs BP (!) 134/95   Pulse 75   Temp 97.9 F (36.6 C) (Oral)   Resp (!) 22   SpO2 96%  Physical Exam Vitals and nursing note reviewed.  Constitutional:      General: He is not in acute distress.    Appearance: He is well-developed. He is not diaphoretic.  HENT:     Head: Normocephalic and atraumatic.  Eyes:     General: No scleral icterus.    Conjunctiva/sclera: Conjunctivae normal.  Cardiovascular:     Rate and Rhythm: Normal rate and regular rhythm.     Heart sounds: Normal heart sounds.  Pulmonary:     Effort: Pulmonary effort is normal. No respiratory distress.     Breath sounds: Normal breath sounds.  Abdominal:  Palpations: Abdomen is soft.     Tenderness: There is no abdominal tenderness.  Musculoskeletal:     Cervical back: Normal range of motion and neck supple.  Skin:    General: Skin is warm and dry.  Neurological:     Mental Status: He is alert.  Psychiatric:        Mood and Affect: Mood is anxious.        Behavior: Behavior normal.     ED Results / Procedures / Treatments   Labs (all labs ordered are listed, but only abnormal results are displayed) Labs Reviewed  BASIC METABOLIC PANEL - Abnormal; Notable for the following components:      Result Value   Glucose, Bld 112 (*)    All other components within normal  limits  HEPATIC FUNCTION PANEL - Abnormal; Notable for the following components:   ALT 118 (*)    Total Bilirubin 2.0 (*)    Bilirubin, Direct 0.3 (*)    Indirect Bilirubin 1.7 (*)    All other components within normal limits  CBC  D-DIMER, QUANTITATIVE  TROPONIN I (HIGH SENSITIVITY)  TROPONIN I (HIGH SENSITIVITY)    EKG None  Radiology DG Chest 2 View  Result Date: 07/20/2022 CLINICAL DATA:  Chest pain, dizziness and sweating. EXAM: CHEST - 2 VIEW COMPARISON:  07/19/2022. FINDINGS: Clear lungs. Normal heart size and mediastinal contours. No pleural effusion or pneumothorax. Visualized bones and upper abdomen are unremarkable. IMPRESSION: No evidence of acute cardiopulmonary disease. Electronically Signed   By: Orvan Falconer M.D.   On: 07/20/2022 16:35   DG Chest 2 View  Result Date: 07/19/2022 CLINICAL DATA:  Palpitations EXAM: CHEST - 2 VIEW COMPARISON:  None Available. FINDINGS: The heart size and mediastinal contours are within normal limits. Both lungs are clear. The visualized skeletal structures are unremarkable. IMPRESSION: No active cardiopulmonary disease. Electronically Signed   By: Helyn Numbers M.D.   On: 07/19/2022 19:34    Procedures Procedures    Medications Ordered in ED Medications - No data to display  ED Course/ Medical Decision Making/ A&P Clinical Course as of 07/20/22 2035  Sat Jul 20, 2022  1758 Basic metabolic panel(!) [AH]  1758 CBC [AH]    Clinical Course User Index [AH] Arthor Captain, PA-C                             Medical Decision Making Given the large differential diagnosis for [facility], the decision making in this case is of high complexity.  After evaluating all of the data points in this case, the presentation of Merrit A Campanelli is NOT consistent with Acute Coronary Syndrome (ACS) and/or myocardial ischemia, pulmonary embolism, aortic dissection; Borhaave's, significant arrythmia, pneumothorax, cardiac tamponade, or other  emergent cardiopulmonary condition.  Further, the presentation of Berny A Licciardi is NOT consistent with pericarditis, myocarditis, cholecystitis, pancreatitis, mediastinitis, endocarditis, new valvular disease.  Additionally, the presentation of Xabian A Longis NOT consistent with flail chest, cardiac contusion, ARDS, or significant intra-thoracic or intra-abdominal bleeding.  Moreover, this presentation is NOT consistent with pneumonia, sepsis, or pyelonephritis.    Strict return and follow-up precautions have been given by me personally or by detailed written instruction given verbally by nursing staff using the teach back method to the patient/family/caregiver(s).  Data Reviewed/Counseling: I have reviewed the patient's vital signs, nursing notes, and other relevant tests/information. I had a detailed discussion regarding the historical points, exam findings, and any  diagnostic results supporting the discharge diagnosis. I also discussed the need for outpatient follow-up and the need to return to the ED if symptoms worsen or if there are any questions or concerns that arise at home.    Amount and/or Complexity of Data Reviewed Labs: ordered. Decision-making details documented in ED Course. Radiology: ordered and independent interpretation performed.    Details: I personally visualized and interpreted the images using our PACS system. Acute findings include:  No acute findings on cxr  ECG/medicine tests: ordered and independent interpretation performed.    Details: Sinsu tachycaria at a rate of 116- no sig changes from previous tracing;           Final Clinical Impression(s) / ED Diagnoses Final diagnoses:  Racing heart beat  Palpitations  Elevated bilirubin    Rx / DC Orders ED Discharge Orders     None         Arthor Captain, PA-C 07/20/22 2035    Derwood Kaplan, MD 07/21/22 2022

## 2022-07-24 ENCOUNTER — Ambulatory Visit (HOSPITAL_BASED_OUTPATIENT_CLINIC_OR_DEPARTMENT_OTHER): Payer: 59 | Admitting: Cardiology

## 2022-07-24 ENCOUNTER — Encounter (HOSPITAL_BASED_OUTPATIENT_CLINIC_OR_DEPARTMENT_OTHER): Payer: Self-pay | Admitting: Cardiology

## 2022-07-24 ENCOUNTER — Other Ambulatory Visit (INDEPENDENT_AMBULATORY_CARE_PROVIDER_SITE_OTHER): Payer: 59

## 2022-07-24 VITALS — BP 118/72 | HR 99 | Ht 66.0 in | Wt 231.8 lb

## 2022-07-24 DIAGNOSIS — R0602 Shortness of breath: Secondary | ICD-10-CM

## 2022-07-24 DIAGNOSIS — R002 Palpitations: Secondary | ICD-10-CM

## 2022-07-24 NOTE — Progress Notes (Signed)
Cardiology Office Note:    Date:  07/24/2022   ID:  [name], DOB 01/30/1971, MRN [id number]  PCP:  Blair Heys, MD   [facility] Providers Cardiologist:  None     Referring MD: Blair Heys, MD    History of Present Illness:    [name] is a 52 y.o. male here for evaluation of  racing heartbeat, palpitations.  In review of the ER visit on 07/20/2022 has been having racing heartbeat and palpitations for the past week.  Has hypertension and hyperlipidemia.  Is on lisinopril hydrochlorothiazide.  15 pound weight loss.  Exertional dyspnea.  Diaphoretic.  Heart rate was 130 after climbing a flight of stairs.  He had an ear infection in April 2024 that required antibiotics.  No night sweats no fevers currently  Family history of coronary artery disease, CABG mother.  No prior clotting disorder.  Admits that over the years he has not been the best with compliant with hypertension medication.  ALT has been between 80 and 111.  Mildly elevated.  He feels some generalized fullness in his abdomen and chest upper back.  Daughter is graduating from [employer] radiology tech.  They are planning on going to [place] next week for vacation.  No past medical history on file.  No past surgical history on file.  Current Medications: Current Meds  Medication Sig   lisinopril-hydrochlorothiazide (ZESTORETIC) 20-12.5 MG tablet Take 1 tablet by mouth daily.   Multiple Vitamins-Minerals (MULTIVITAMIN WITH MINERALS) tablet Take 1 tablet by mouth daily.     Allergies:   Hydromorphone   Social History   Socioeconomic History   Marital status: Married    Spouse name: Not on file   Number of children: Not on file   Years of education: Not on file   Highest education level: Not on file  Occupational History   Not on file  Tobacco Use   Smoking status: Never   Smokeless tobacco: Never  Substance and Sexual Activity   Alcohol use: Not on file   Drug use: Not on file    Sexual activity: Not on file  Other Topics Concern   Not on file  Social History Narrative   Not on file   Social Determinants of Health   Financial Resource Strain: Not on file  Food Insecurity: Not on file  Transportation Needs: Not on file  Physical Activity: Not on file  Stress: Not on file  Social Connections: Not on file     Family History: The patient's   ROS:   Please see the history of present illness.     All other systems reviewed and are negative.  EKGs/Labs/Other Studies Reviewed:    The following studies were reviewed today: Echocardiogram pending  EKG: 2 prior EKGs reviewed 1 sinus rhythm 96 1 sinus tachycardia 116 normal intervals 07/24/2022-sinus rhythm 99 PVC noted normal intervals nonspecific ST-T wave changes   Recent Labs: 07/19/2022: TSH 0.509 07/20/2022: ALT 118; BUN 18; Creatinine, Ser 1.11; Hemoglobin 16.6; Platelets 227; Potassium 3.8; Sodium 141  Recent Lipid Panel No results found for: "CHOL", "TRIG", "HDL", "CHOLHDL", "VLDL", "LDLCALC", "LDLDIRECT"   Risk Assessment/Calculations:               Physical Exam:    VS:  BP 118/72   Pulse 99   Ht 5\' 6"  (1.676 m)   Wt 231 lb 12.8 oz (105.1 kg)   SpO2 96%   BMI 37.41 kg/m     Wt Readings  from Last 3 Encounters:  07/24/22 231 lb 12.8 oz (105.1 kg)  07/19/22 231 lb (104.8 kg)     GEN:  Well nourished, well developed in no acute distress HEENT: Normal NECK: No JVD; No carotid bruits LYMPHATICS: No lymphadenopathy CARDIAC: RRR, no murmurs, rubs, gallops RESPIRATORY:  Clear to auscultation without rales, wheezing or rhonchi  ABDOMEN: Soft, non-tender, non-distended MUSCULOSKELETAL:  No edema; No deformity  SKIN: Warm and dry NEUROLOGIC:  Alert and oriented x 3 PSYCHIATRIC:  Normal affect   ASSESSMENT:    1. Shortness of breath   2. Palpitations    PLAN:    In order of problems listed above:  Palpitations/shortness of breath Essential hypertension - I will check an  echocardiogram to ensure proper structure and function of his heart.  TSH has been normal.  Troponins were normal TSH normal electrolytes normal.  White count normal.  Recent ear/skin infection requiring several days of antibiotics in April a month ago.  He is not having any residual fevers night sweats.  Tachycardia noted.  When auscultating him his heart rate did increase.  I will check a Zio patch monitor as well.  We will hold off on beta-blocker for now.  Further investigation may include stress test in the future.  Coronary CT would be good but his heart rate may be too fast for that.  Continue with current antihypertensives            Medication Adjustments/Labs and Tests Ordered: Current medicines are reviewed at length with the patient today.  Concerns regarding medicines are outlined above.  Orders Placed This Encounter  Procedures   Seder TERM MONITOR (3-14 DAYS)   EKG 12-Lead   ECHOCARDIOGRAM COMPLETE   No orders of the defined types were placed in this encounter.   Patient Instructions  Medication Instructions:  Your physician recommends that you continue on your current medications as directed. Please refer to the Current Medication list given to you today.  *If you need a refill on your cardiac medications before your next appointment, please call your pharmacy*  Testing/Procedures: Your physician has requested that you have an echocardiogram. Echocardiography is a painless test that uses sound waves to create images of your heart. It provides your doctor with information about the size and shape of your heart and how well your heart's chambers and valves are working. This procedure takes approximately one hour. There are no restrictions for this procedure. Please do NOT wear cologne, perfume, aftershave, or lotions (deodorant is allowed). Please arrive 15 minutes prior to your appointment time.  Your physician has recommended that you wear a Zio monitor.   This monitor  is a medical device that records the heart's electrical activity. Doctors most often use these monitors to diagnose arrhythmias. Arrhythmias are problems with the speed or rhythm of the heartbeat. The monitor is a small device applied to your chest. You can wear one while you do your normal daily activities. While wearing this monitor if you have any symptoms to push the button and record what you felt. Once you have worn this monitor for the period of time provider prescribed (Usually 14 days), you will return the monitor device in the postage paid box. Once it is returned they will download the data collected and provide [place] with a report which the provider will then review and we will call you with those results. Important tips:  Avoid showering during the first 24 hours of wearing the monitor. Avoid excessive sweating to help  maximize wear time. Do not submerge the device, no hot tubs, and no swimming pools. Keep any lotions or oils away from the patch. After 24 hours you may shower with the patch on. Take brief showers with your back facing the shower head.  Do not remove patch once it has been placed because that will interrupt data and decrease adhesive wear time. Push the button when you have any symptoms and write down what you were feeling. Once you have completed wearing your monitor, remove and place into box which has postage paid and place in your outgoing mailbox.  If for some reason you have misplaced your box then call our office and we can provide another box and/or mail it off for you.  Follow-Up: At [facility], you and your health needs are our priority.  As part of our continuing mission to provide you with exceptional heart care, we have created designated Provider Care Teams.  These Care Teams include your primary Cardiologist (physician) and Advanced Practice Providers (APPs -  Physician Assistants and Nurse Practitioners) who all work together to provide you with the  care you need, when you need it.  We recommend signing up for the patient portal called "MyChart".  Sign up information is provided on this After Visit Summary.  MyChart is used to connect with patients for Virtual Visits (Telemedicine).  Patients are able to view lab/test results, encounter notes, upcoming appointments, etc.  Non-urgent messages can be sent to your provider as well.   To learn more about what you can do with MyChart, go to [email or web address].    Your next appointment:   6-8 week(s)  Provider:   Dr. Anne Fu or APP     Signed, Donato Schultz, MD  07/24/2022 4:32 PM    [facility]

## 2022-07-24 NOTE — Patient Instructions (Signed)
Medication Instructions:  Your physician recommends that you continue on your current medications as directed. Please refer to the Current Medication list given to you today.  *If you need a refill on your cardiac medications before your next appointment, please call your pharmacy*  Testing/Procedures: Your physician has requested that you have an echocardiogram. Echocardiography is a painless test that uses sound waves to create images of your heart. It provides your doctor with information about the size and shape of your heart and how well your heart's chambers and valves are working. This procedure takes approximately one hour. There are no restrictions for this procedure. Please do NOT wear cologne, perfume, aftershave, or lotions (deodorant is allowed). Please arrive 15 minutes prior to your appointment time.  Your physician has recommended that you wear a Zio monitor.   This monitor is a medical device that records the heart's electrical activity. Doctors most often use these monitors to diagnose arrhythmias. Arrhythmias are problems with the speed or rhythm of the heartbeat. The monitor is a small device applied to your chest. You can wear one while you do your normal daily activities. While wearing this monitor if you have any symptoms to push the button and record what you felt. Once you have worn this monitor for the period of time provider prescribed (Usually 14 days), you will return the monitor device in the postage paid box. Once it is returned they will download the data collected and provide [place] with a report which the provider will then review and we will call you with those results. Important tips:  Avoid showering during the first 24 hours of wearing the monitor. Avoid excessive sweating to help maximize wear time. Do not submerge the device, no hot tubs, and no swimming pools. Keep any lotions or oils away from the patch. After 24 hours you may shower with the patch on. Take  brief showers with your back facing the shower head.  Do not remove patch once it has been placed because that will interrupt data and decrease adhesive wear time. Push the button when you have any symptoms and write down what you were feeling. Once you have completed wearing your monitor, remove and place into box which has postage paid and place in your outgoing mailbox.  If for some reason you have misplaced your box then call our office and we can provide another box and/or mail it off for you.  Follow-Up: At [facility], you and your health needs are our priority.  As part of our continuing mission to provide you with exceptional heart care, we have created designated Provider Care Teams.  These Care Teams include your primary Cardiologist (physician) and Advanced Practice Providers (APPs -  Physician Assistants and Nurse Practitioners) who all work together to provide you with the care you need, when you need it.  We recommend signing up for the patient portal called "MyChart".  Sign up information is provided on this After Visit Summary.  MyChart is used to connect with patients for Virtual Visits (Telemedicine).  Patients are able to view lab/test results, encounter notes, upcoming appointments, etc.  Non-urgent messages can be sent to your provider as well.   To learn more about what you can do with MyChart, go to [email or web address].    Your next appointment:   6-8 week(s)  Provider:   Dr. Anne Fu or APP

## 2022-08-07 ENCOUNTER — Other Ambulatory Visit: Payer: Self-pay | Admitting: Gastroenterology

## 2022-08-07 DIAGNOSIS — R109 Unspecified abdominal pain: Secondary | ICD-10-CM

## 2022-08-07 DIAGNOSIS — R194 Change in bowel habit: Secondary | ICD-10-CM

## 2022-08-07 DIAGNOSIS — R634 Abnormal weight loss: Secondary | ICD-10-CM

## 2022-08-08 ENCOUNTER — Ambulatory Visit
Admission: RE | Admit: 2022-08-08 | Discharge: 2022-08-08 | Disposition: A | Discharge status: Home / Self Care | Payer: 59 | Source: Ambulatory Visit | Attending: Gastroenterology | Admitting: Gastroenterology

## 2022-08-08 DIAGNOSIS — R109 Unspecified abdominal pain: Secondary | ICD-10-CM

## 2022-08-08 DIAGNOSIS — R194 Change in bowel habit: Secondary | ICD-10-CM

## 2022-08-08 DIAGNOSIS — R634 Abnormal weight loss: Secondary | ICD-10-CM

## 2022-08-08 MED ORDER — IOPAMIDOL (ISOVUE-300) INJECTION 61%
100.0000 mL | Freq: Once | INTRAVENOUS | Status: AC | PRN
  Administered 2022-08-08: 100 mL via INTRAVENOUS

## 2022-08-23 ENCOUNTER — Telehealth: Payer: Self-pay | Admitting: Gastroenterology

## 2022-08-23 NOTE — Telephone Encounter (Signed)
Dr. Tomasa Rand,  Urgent referral in [place] for abdominal pain and patient has lost 23 lbs since 06/21/22.  Patient recently saw Dr. Ewing Schlein because his PCP scheduled with their office (records under media tab in EPIC).  Patient said he was "not pleased" with Dr. Ewing Schlein and his mother is a patient of [facility] and requesting to be seen here.  Please review and advise urgency and scheduling?    Thanks [employer] MD 6/7/24AM

## 2022-08-28 ENCOUNTER — Ambulatory Visit (INDEPENDENT_AMBULATORY_CARE_PROVIDER_SITE_OTHER): Payer: 59

## 2022-08-28 DIAGNOSIS — R002 Palpitations: Secondary | ICD-10-CM

## 2022-08-28 DIAGNOSIS — R0602 Shortness of breath: Secondary | ICD-10-CM | POA: Diagnosis not present

## 2022-08-28 LAB — ECHOCARDIOGRAM COMPLETE
Area-P 1/2: 3.85 cm2
S' Lateral: 3.19 cm

## 2022-08-30 ENCOUNTER — Encounter: Payer: Self-pay | Admitting: Gastroenterology

## 2022-09-02 ENCOUNTER — Encounter (HOSPITAL_BASED_OUTPATIENT_CLINIC_OR_DEPARTMENT_OTHER): Payer: Self-pay | Admitting: Family

## 2022-09-02 ENCOUNTER — Ambulatory Visit (HOSPITAL_BASED_OUTPATIENT_CLINIC_OR_DEPARTMENT_OTHER): Payer: 59 | Admitting: Family

## 2022-09-02 VITALS — BP 122/80 | HR 67 | Ht 66.0 in | Wt 231.7 lb

## 2022-09-02 DIAGNOSIS — R002 Palpitations: Secondary | ICD-10-CM

## 2022-09-02 DIAGNOSIS — I10 Essential (primary) hypertension: Secondary | ICD-10-CM | POA: Diagnosis not present

## 2022-09-02 DIAGNOSIS — Z8249 Family history of ischemic heart disease and other diseases of the circulatory system: Secondary | ICD-10-CM

## 2022-09-02 MED ORDER — METOPROLOL SUCCINATE ER 25 MG PO TB24: 25.0000 mg | ORAL_TABLET | Freq: Every day | ORAL | 1 refills | Status: DC

## 2022-09-02 NOTE — Progress Notes (Unsigned)
  Cardiology Office Note:  .   Date:  09/04/2022  ID:  [name], DOB 28-Apr-1970, MRN [id number] PCP: Blair Heys, MD  [facility] Providers Cardiologist:  None    History of Present Illness: .   [name] is a 52 y.o. male hx of hypretnsion and palpitations. Last seen 07/24/22 by Dr. Anne Fu. Echo and ZIO were ordered. Echo normal LVEF, no significant valvular abnormalities. Monitor with predominantly normal sinus rhythm, brief episodes of PAT.   Presents today for follow up independently. Still with some GI issues but overall has been better controlled. Upcoming appointment with GI to discuss gallbladder.   He is checking BP and HR at home with heart rate routinely 100s through June 1st. Starting early June would have significant dizziness while standing with near syncopal episodes. Had a few days of BP 80-90s/60s and tachycardia. Then started leveling out on its own. Yesterday while visiting with a friend had episode of symptomatic tachycardia associated with a sharp left sided "squeezing" chest pain. Overall symptoms are at rest and not with exertion. Reports no stress. Has not had caffeine in a couple months. No OTC agents.   ROS: Please see the history of present illness.    All other systems reviewed and are negative.   Studies Reviewed: Marland Kitchen    EKG:  EKG is not ordered today.   Risk Assessment/Calculations:          Physical Exam:   VS:  BP 122/80   Pulse 67   Ht 5\' 6"  (1.676 m)   Wt 231 lb 11.2 oz (105.1 kg)   SpO2 97%   BMI 37.40 kg/m    Wt Readings from Last 3 Encounters:  09/02/22 231 lb 11.2 oz (105.1 kg)  07/24/22 231 lb 12.8 oz (105.1 kg)  07/19/22 231 lb (104.8 kg)    GEN: Well nourished, well developed in no acute distress NECK: No JVD; No carotid bruits CARDIAC: RRR, no murmurs, rubs, gallops RESPIRATORY:  Clear to auscultation without rales, wheezing or rhonchi  ABDOMEN: Soft, non-tender, non-distended EXTREMITIES:  No edema; No deformity    ASSESSMENT AND PLAN: .    HTN - Initial reading 148/93. Improved by repeat. Home readings routinely <130/80. Continue present antihypertensive regimen of Lisinopril-hydrochlorothiazide 20-12.5mg  every day.   Preop clearance - Possible endoscopy, colonoscopy upcoming. Exercise tolerance >4 METS. Per AHA/ACC guidelines, he is deemed acceptable risk for the planned procedure without additional cardiovascular testing. Will route to surgical team so they are aware.    Palpitations / Dizziness - Monitor with brief episodes of PAT.  Will have hypotension with dizziness with elevated heart rates. Rx Toprol 25mg  every day. Continue to avoid caffeine, manage stress well.   Family history of cardiovascular disease -  ASCVD risk 2.4% which is low. Recommend aiming for 150 minutes of moderate intensity activity per week and following a heart healthy diet.  Discussed possible coronary calcium score versus cardiac CTA due to family history of coronary disease which she will consider and discuss again at follow-up.       Dispo: follow up in 6 months with Dr. Anne Fu or APP  Signed, Alver Sorrow, NP

## 2022-09-02 NOTE — Patient Instructions (Signed)
Medication Instructions:  START METOPROLOL SUCC 25 MG DAILY   *If you need a refill on your cardiac medications before your next appointment, please call your pharmacy*  Lab Work: NONE  Testing/Procedures: NONE  Follow-Up: At [facility], you and your health needs are our priority.  As part of our continuing mission to provide you with exceptional heart care, we have created designated Provider Care Teams.  These Care Teams include your primary Cardiologist (physician) and Advanced Practice Providers (APPs -  Physician Assistants and Nurse Practitioners) who all work together to provide you with the care you need, when you need it.  We recommend signing up for the patient portal called "MyChart".  Sign up information is provided on this After Visit Summary.  MyChart is used to connect with patients for Virtual Visits (Telemedicine).  Patients are able to view lab/test results, encounter notes, upcoming appointments, etc.  Non-urgent messages can be sent to your provider as well.   To learn more about what you can do with MyChart, go to [email or web address].    Your next appointment:   6 month(s)  Provider:   DR Anne Fu AT EITHER [facility] OR [facility] LOCATION

## 2022-09-04 ENCOUNTER — Encounter (HOSPITAL_BASED_OUTPATIENT_CLINIC_OR_DEPARTMENT_OTHER): Payer: Self-pay | Admitting: Family

## 2022-10-22 ENCOUNTER — Encounter: Payer: Self-pay | Admitting: Gastroenterology

## 2022-10-22 ENCOUNTER — Ambulatory Visit: Payer: 59 | Admitting: Gastroenterology

## 2022-10-22 ENCOUNTER — Other Ambulatory Visit (INDEPENDENT_AMBULATORY_CARE_PROVIDER_SITE_OTHER): Payer: 59

## 2022-10-22 VITALS — BP 120/70 | HR 84 | Ht 67.0 in | Wt 236.0 lb

## 2022-10-22 DIAGNOSIS — Z1211 Encounter for screening for malignant neoplasm of colon: Secondary | ICD-10-CM | POA: Diagnosis not present

## 2022-10-22 DIAGNOSIS — R1011 Right upper quadrant pain: Secondary | ICD-10-CM | POA: Diagnosis not present

## 2022-10-22 DIAGNOSIS — R1012 Left upper quadrant pain: Secondary | ICD-10-CM | POA: Diagnosis not present

## 2022-10-22 LAB — CBC WITH DIFFERENTIAL/PLATELET
Basophils Absolute: 0.1 10*3/uL (ref 0.0–0.1)
Basophils Relative: 0.8 % (ref 0.0–3.0)
Eosinophils Absolute: 0.2 10*3/uL (ref 0.0–0.7)
Eosinophils Relative: 2.6 % (ref 0.0–5.0)
HCT: 44.6 % (ref 39.0–52.0)
Hemoglobin: 15 g/dL (ref 13.0–17.0)
Lymphocytes Relative: 25.6 % (ref 12.0–46.0)
Lymphs Abs: 2.2 10*3/uL (ref 0.7–4.0)
MCHC: 33.7 g/dL (ref 30.0–36.0)
MCV: 92.5 fl (ref 78.0–100.0)
Monocytes Absolute: 0.7 10*3/uL (ref 0.1–1.0)
Monocytes Relative: 8.4 % (ref 3.0–12.0)
Neutro Abs: 5.3 10*3/uL (ref 1.4–7.7)
Neutrophils Relative %: 62.6 % (ref 43.0–77.0)
Platelets: 213 10*3/uL (ref 150.0–400.0)
RBC: 4.82 Mil/uL (ref 4.22–5.81)
RDW: 13.5 % (ref 11.5–15.5)
WBC: 8.5 10*3/uL (ref 4.0–10.5)

## 2022-10-22 LAB — COMPREHENSIVE METABOLIC PANEL
ALT: 26 U/L (ref 0–53)
AST: 17 U/L (ref 0–37)
Albumin: 4.7 g/dL (ref 3.5–5.2)
Alkaline Phosphatase: 53 U/L (ref 39–117)
BUN: 17 mg/dL (ref 6–23)
CO2: 25 mEq/L (ref 19–32)
Calcium: 10 mg/dL (ref 8.4–10.5)
Chloride: 107 mEq/L (ref 96–112)
Creatinine, Ser: 1.01 mg/dL (ref 0.40–1.50)
GFR: 87.01 mL/min (ref >60.00)
Glucose, Bld: 78 mg/dL (ref 70–99)
Potassium: 4 mEq/L (ref 3.5–5.1)
Sodium: 142 mEq/L (ref 135–145)
Total Bilirubin: 1 mg/dL (ref 0.2–1.2)
Total Protein: 7.5 g/dL (ref 6.0–8.3)

## 2022-10-22 LAB — LIPASE: Lipase: 26 U/L (ref 11.0–59.0)

## 2022-10-22 NOTE — Patient Instructions (Signed)
Your provider has requested that you go to the basement level for lab work before leaving today. Press "B" on the elevator. The lab is located at the first door on the left as you exit the elevator.  You have been scheduled for an endoscopy and colonoscopy. Please follow the written instructions given to you at your visit today.  Please pick up your prep supplies at the pharmacy within the next 1-3 days.  If you use inhalers (even only as needed), please bring them with you on the day of your procedure.  DO NOT TAKE 7 DAYS PRIOR TO TEST- Trulicity (dulaglutide) Ozempic, Wegovy (semaglutide) Mounjaro (tirzepatide) Bydureon Bcise (exanatide extended release)  DO NOT TAKE 1 DAY PRIOR TO YOUR TEST Rybelsus (semaglutide) Adlyxin (lixisenatide) Victoza (liraglutide) Byetta (exanatide) ________________________________________________________________________  _______________________________________________________  If your blood pressure at your visit was 140/90 or greater, please contact your primary care physician to follow up on this.  _______________________________________________________  If you are age 50 or older, your body mass index should be between 23-30. Your Body mass index is 36.96 kg/m. If this is out of the aforementioned range listed, please consider follow up with your Primary Care Provider.  If you are age 58 or younger, your body mass index should be between 19-25. Your Body mass index is 36.96 kg/m. If this is out of the aformentioned range listed, please consider follow up with your Primary Care Provider.   ________________________________________________________  The [facility] providers would like to encourage you to use [facility] to communicate with providers for non-urgent requests or questions.  Due to [facility] hold times on the telephone, sending your provider a message by [facility] may be a faster and more efficient way to get a response.  Please allow 48 business  hours for a response.  Please remember that this is for non-urgent requests.  _______________________________________________________

## 2022-10-22 NOTE — Progress Notes (Signed)
Chief Complaint: LUQ pain and RUQ pain Primary GI MD: Gentry Fitz ( Dr. Tomasa Rand accepted transfer)  HPI: 52 year old male history of hypertension presents for evaluation of upper abdominal pain  Previous patient of Dr. Ewing Schlein, no notes available.  Recently seen by [facility] 09/06/2022 for evaluation of gallstones.  Patient has been having severe RUQ pain rating across upper abdomen and down both sides that was worse with eating and associated with severe diarrhea.  This was intermittent ongoing for the last few months.  Of note patient has been seen in the hospital 5/3 and 5/4 for heart palpitations.  He was referred to cardiologist and underwent workup which showed normal echo and monitor showed occasional PVCs.  T. bili 5/4 was elevated at 2.0 (indirect bili 1.7).  ALT 118, normal AST and normal alk phos  After being seen by Dr. Ewing Schlein he underwent CT scan which was done 5/23 which showed multiple small gallstones but no other acute findings.    After seeing [facility] they recommended to see GI first and if continuing to have symptoms they would recommend to proceed with cholecystectomy.  Scheduled for follow-up with them 12/10/2022  Patient states April and may he was having gnawing/burning LUQ pain.  Since then his pain has gotten better.  He tried omeprazole 20 Mg over-the-counter which provided relief.  He was also having RUQ pain that was worse with eating.  Reported diarrhea associated with eating as well.  This pain and change in bowel movement have resolved since his hospital visit in May.  He also states during his hospital visit he was having epigastric pain that radiated through his back.  No lipase was drawn.  He states he got a colonoscopy more than 10 years ago for abdominal pain which he thinks showed no polyps and no abnormalities.  This was done by Dr. Ewing Schlein.  Denies NSAID use.  Denies alcohol use.  Overall he is doing well now but he is concerned due to his previous  symptoms  Past Medical History:  Diagnosis Date   Hypertension    Palpitations     No past surgical history on file.  Current Outpatient Medications  Medication Sig Dispense Refill   lisinopril-hydrochlorothiazide (ZESTORETIC) 20-12.5 MG tablet Take 1 tablet by mouth daily.     metoprolol succinate (TOPROL XL) 25 MG 24 hr tablet Take 1 tablet (25 mg total) by mouth daily. 90 tablet 1   Multiple Vitamins-Minerals (MULTIVITAMIN WITH MINERALS) tablet Take 1 tablet by mouth daily.     omeprazole (PRILOSEC OTC) 20 MG tablet Take 20 mg by mouth daily.     No current facility-administered medications for this visit.    Allergies as of 10/22/2022 - Review Complete 09/04/2022  Allergen Reaction Noted   Hydromorphone Itching 07/19/2022    Family History  Problem Relation Age of Onset   Hyperlipidemia Mother    Valvular heart disease Mother    Heart attack Father    Valvular heart disease Maternal Grandfather     Social History   Socioeconomic History   Marital status: Married    Spouse name: Not on file   Number of children: Not on file   Years of education: Not on file   Highest education level: Not on file  Occupational History   Not on file  Tobacco Use   Smoking status: Never   Smokeless tobacco: Never  Substance and Sexual Activity   Alcohol use: Not on file   Drug use: Not on file   Sexual  activity: Not on file  Other Topics Concern   Not on file  Social History Narrative   Not on file   Social Determinants of Health   Financial Resource Strain: Not on file  Food Insecurity: Not on file  Transportation Needs: Not on file  Physical Activity: Not on file  Stress: Not on file  Social Connections: Not on file  Intimate Partner Violence: Not on file    Review of Systems:    Constitutional: No weight loss, fever, chills, weakness or fatigue HEENT: Eyes: No change in vision               Ears, Nose, Throat:  No change in hearing or congestion Skin: No rash  or itching Cardiovascular: No chest pain, chest pressure or palpitations   Respiratory: No SOB or cough Gastrointestinal: See HPI and otherwise negative Genitourinary: No dysuria or change in urinary frequency Neurological: No headache, dizziness or syncope Musculoskeletal: No new muscle or joint pain Hematologic: No bleeding or bruising Psychiatric: No history of depression or anxiety    Physical Exam:  Vital signs: There were no vitals taken for this visit.  Constitutional: NAD, Well developed, Well nourished, alert and cooperative Head:  Normocephalic and atraumatic. Eyes:   PEERL, EOMI. No icterus. Conjunctiva pink. Respiratory: Respirations even and unlabored. Lungs clear to auscultation bilaterally.   No wheezes, crackles, or rhonchi.  Cardiovascular:  Regular rate and rhythm. No peripheral edema, cyanosis or pallor.  Gastrointestinal:  Soft, nondistended, nontender. No rebound or guarding. Normal bowel sounds. No appreciable masses or hepatomegaly. Rectal:  Not performed.  Msk:  Symmetrical without gross deformities. Without edema, no deformity or joint abnormality.  Neurologic:  Alert and  oriented x4;  grossly normal neurologically.  Skin:   Dry and intact without significant lesions or rashes. Psychiatric: Oriented to person, place and time. Demonstrates good judgement and reason without abnormal affect or behaviors.   RELEVANT LABS AND IMAGING: CBC    Component Value Date/Time   WBC 7.6 07/20/2022 1544   RBC 5.39 07/20/2022 1544   HGB 16.6 07/20/2022 1544   HCT 47.0 07/20/2022 1544   PLT 227 07/20/2022 1544   MCV 87.2 07/20/2022 1544   MCH 30.8 07/20/2022 1544   MCHC 35.3 07/20/2022 1544   RDW 12.8 07/20/2022 1544   LYMPHSABS 1.5 07/02/2008 1940   MONOABS 1.5 (H) 07/02/2008 1940   EOSABS 0.1 07/02/2008 1940   BASOSABS 0.0 07/02/2008 1940    CMP     Component Value Date/Time   NA 141 07/20/2022 1544   K 3.8 07/20/2022 1544   CL 103 07/20/2022 1544   CO2  23 07/20/2022 1544   GLUCOSE 112 (H) 07/20/2022 1544   BUN 18 07/20/2022 1544   CREATININE 1.11 07/20/2022 1544   CALCIUM 10.0 07/20/2022 1544   PROT 7.4 07/20/2022 1801   ALBUMIN 4.3 07/20/2022 1801   AST 34 07/20/2022 1801   ALT 118 (H) 07/20/2022 1801   ALKPHOS 85 07/20/2022 1801   BILITOT 2.0 (H) 07/20/2022 1801   GFRNONAA >60 07/20/2022 1544   GFRAA  07/02/2008 1940    >60        The eGFR has been calculated using the MDRD equation. This calculation has not been validated in all clinical situations. eGFR's persistently <60 mL/min signify possible Chronic Kidney Disease.     Assessment/Plan:   LUQ pain Gnawing/burning LUQ pain that has improved with omeprazole.  Denies GERD.  During his hospitalization in May he was also having epigastric  pain radiating through his back, although no lipase was drawn in emergency department.  Patient could have had a bout of gallstone pancreatitis causing his epigastric pain.  Suspect gastritis/PUD causing his LUQ burning/gnawing pain since it has improved taking omeprazole 20 Mg once daily. --- EGD for further evaluation --- I thoroughly discussed the procedure with the patient (at bedside) to include nature of the procedure, alternatives, benefits, and risks (including but not limited to bleeding, infection, perforation, anesthesia/cardiac pulmonary complications).  Patient verbalized understanding and gave verbal consent to proceed with procedure.  --- Continue omeprazole 20 Mg once daily.  If EGD is negative would recommend cessation of omeprazole 20 Mg.  If symptoms return can restart medication. --- Educated patient on lifestyle modifications  RUQ pain CT showing gallstones.  Associated with eating.  Has since resolved.  Suspect he was having symptomatic cholelithiasis that has resolved with change in eating habits.  He is hesitant on cholecystectomy. --- Continue follow-up as scheduled with [facility] --- Will check LFTs today  Screening for  colon cancer --- Due for screening colonoscopy --- I thoroughly discussed the procedure with the patient (at bedside) to include nature of the procedure, alternatives, benefits, and risks (including but not limited to bleeding, infection, perforation, anesthesia/cardiac pulmonary complications).  Patient verbalized understanding and gave verbal consent to proceed with procedure.   Lara Mulch [facility] 10/22/2022, 1:19 PM  Cc: Blair Heys, MD

## 2022-10-23 NOTE — Progress Notes (Signed)
Agree with the assessment and plan as outlined by Boone Master, PA-C.

## 2022-10-25 ENCOUNTER — Encounter: Payer: Self-pay | Admitting: Gastroenterology

## 2022-10-25 ENCOUNTER — Ambulatory Visit: Payer: 59 | Admitting: Gastroenterology

## 2022-10-25 VITALS — BP 93/62 | HR 98 | Temp 98.6°F | Resp 24 | Ht 67.0 in | Wt 236.0 lb

## 2022-10-25 DIAGNOSIS — K449 Diaphragmatic hernia without obstruction or gangrene: Secondary | ICD-10-CM

## 2022-10-25 DIAGNOSIS — D122 Benign neoplasm of ascending colon: Secondary | ICD-10-CM

## 2022-10-25 DIAGNOSIS — Z1211 Encounter for screening for malignant neoplasm of colon: Secondary | ICD-10-CM | POA: Diagnosis present

## 2022-10-25 DIAGNOSIS — R1012 Left upper quadrant pain: Secondary | ICD-10-CM | POA: Diagnosis not present

## 2022-10-25 DIAGNOSIS — K222 Esophageal obstruction: Secondary | ICD-10-CM

## 2022-10-25 HISTORY — PX: COLONOSCOPY WITH ESOPHAGOGASTRODUODENOSCOPY (EGD): SHX5779

## 2022-10-25 MED ORDER — SODIUM CHLORIDE 0.9 % IV SOLN: 500.0000 mL | Freq: Once | INTRAVENOUS | Status: DC

## 2022-10-25 NOTE — Progress Notes (Signed)
History and Physical Interval Note:  10/25/2022 7:54 AM  [name]  has presented today for endoscopic procedure(s), with the diagnosis of  Encounter Diagnoses  Name Primary?   LUQ pain Yes   Screening for colon cancer   .  The various methods of evaluation and treatment have been discussed with the patient and/or family. After consideration of risks, benefits and other options for treatment, the patient has consented to  the endoscopic procedure(s).   The patient's history has been reviewed, patient examined, no change in status, stable for endoscopic procedure(s).  I have reviewed the patient's chart and labs.  Questions were answered to the patient's satisfaction.     Rudie Sermons E. Tomasa Rand, MD [facility]

## 2022-10-25 NOTE — Patient Instructions (Addendum)
- Resume previous diet. - Continue present medications. - Await pathology results. - Continue once daily omeprazole indefinitely - Recommend dietary modifications for GERD - Repeat colonoscopy (date not yet determined) for surveillance based on pathology results.  YOU HAD AN ENDOSCOPIC PROCEDURE TODAY AT THE [facility]:   Refer to the procedure report that was given to you for any specific questions about what was found during the examination.  If the procedure report does not answer your questions, please call your gastroenterologist to clarify.  If you requested that your care partner not be given the details of your procedure findings, then the procedure report has been included in a sealed envelope for you to review at your convenience later.  YOU SHOULD EXPECT: Some feelings of bloating in the abdomen. Passage of more gas than usual.  Walking can help get rid of the air that was put into your GI tract during the procedure and reduce the bloating. If you had a lower endoscopy (such as a colonoscopy or flexible sigmoidoscopy) you may notice spotting of blood in your stool or on the toilet paper. If you underwent a bowel prep for your procedure, you may not have a normal bowel movement for a few days.  Please Note:  You might notice some irritation and congestion in your nose or some drainage.  This is from the oxygen used during your procedure.  There is no need for concern and it should clear up in a day or so.  SYMPTOMS TO REPORT IMMEDIATELY:  Following lower endoscopy (colonoscopy or flexible sigmoidoscopy):  Excessive amounts of blood in the stool  Significant tenderness or worsening of abdominal pains  Swelling of the abdomen that is new, acute  Fever of 100F or higher  Following upper endoscopy (EGD)  Vomiting of blood or coffee ground material  New chest pain or pain under the shoulder blades  Painful or persistently difficult swallowing  New shortness of  breath  Fever of 100F or higher  Black, tarry-looking stools  For urgent or emergent issues, a gastroenterologist can be reached at any hour by calling [phone]. Do not use MyChart messaging for urgent concerns.    DIET:  We do recommend a small meal at first, but then you may proceed to your regular diet.  Drink plenty of fluids but you should avoid alcoholic beverages for 24 hours.  ACTIVITY:  You should plan to take it easy for the rest of today and you should NOT DRIVE or use heavy machinery until tomorrow (because of the sedation medicines used during the test).    FOLLOW UP: Our staff will call the number listed on your records the next business day following your procedure.  We will call around 7:15- 8:00 am to check on you and address any questions or concerns that you may have regarding the information given to you following your procedure. If we do not reach you, we will leave a message.     If any biopsies were taken you will be contacted by phone or by letter within the next 1-3 weeks.  Please call us at [phone] if you have not heard about the biopsies in 3 weeks.    SIGNATURES/CONFIDENTIALITY: You and/or your care partner have signed paperwork which will be entered into your electronic medical record.  These signatures attest to the fact that that the information above on your After Visit Summary has been reviewed and is understood.  Full responsibility of the confidentiality of this discharge  information lies with you and/or your care-partner.

## 2022-10-25 NOTE — Progress Notes (Signed)
Sedate, gd SR, tolerated procedure well, VSS, report to RN 

## 2022-10-25 NOTE — Op Note (Signed)
[facility] Patient Name: [name] Procedure Date: 10/25/2022 7:55 AM MRN: [id number] Endoscopist: Lorin Picket E. Tomasa Rand , MD, [id number] Age: 52 Referring MD:  Date of Birth: [birth year] Gender: Male Account #: [id number] Procedure:                Upper GI endoscopy Indications:              Epigastric abdominal pain (has improved                            significantly since starting PPI) Medicines:                Monitored Anesthesia Care Procedure:                Pre-Anesthesia Assessment:                           - Prior to the procedure, a History and Physical                            was performed, and patient medications and                            allergies were reviewed. The patient's tolerance of                            previous anesthesia was also reviewed. The risks                            and benefits of the procedure and the sedation                            options and risks were discussed with the patient.                            All questions were answered, and informed consent                            was obtained. Prior Anticoagulants: The patient has                            taken no anticoagulant or antiplatelet agents. ASA                            Grade Assessment: II - A patient with mild systemic                            disease. After reviewing the risks and benefits,                            the patient was deemed in satisfactory condition to                            undergo the procedure.  After obtaining informed consent, the endoscope was                            passed under direct vision. Throughout the                            procedure, the patient's blood pressure, pulse, and                            oxygen saturations were monitored continuously. The                            Olympus scope [id number] was introduced through the                            mouth, and advanced to the  second part of duodenum.                            The upper GI endoscopy was accomplished without                            difficulty. The patient tolerated the procedure                            well. Scope In: Scope Out: Findings:                 The examined portions of the nasopharynx,                            oropharynx and larynx were normal.                           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                           A low-grade of narrowing Schatzki ring was found at                            the gastroesophageal junction.                           The exam of the esophagus was otherwise normal.                           A 4 cm hiatal hernia was present.                           The entire examined stomach was normal. Biopsies                            were taken with a cold forceps for Helicobacter  pylori testing. Estimated blood loss was minimal.                           The examined duodenum was normal. Complications:            No immediate complications. Estimated Blood Loss:     Estimated blood loss was minimal. Impression:               - The examined portions of the nasopharynx,                            oropharynx and larynx were normal.                           - Ectopic gastric mucosa in the proximal esophagus.                           - Low-grade of narrowing Schatzki ring.                           - 4 cm hiatal hernia.                           - Normal stomach. Biopsied.                           - Normal examined duodenum.                           - No endoscopic abnormalities to explain patient's                            pain. Given presence of Schatzki ring and                            subsequent resolution of pain with PPI, query                            whether his pain may have been secondary to                             GERD/reflux esophagitis. Recommendation:           - Patient has a contact number available for                            emergencies. The signs and symptoms of potential                            delayed complications were discussed with the                            patient. Return to normal activities tomorrow.                            Written discharge instructions were provided to the  patient.                           - Resume previous diet.                           - Continue present medications.                           - Await pathology results.                           - Continue once daily omeprazole indefinitely                           - Recommend dietary modifications for GERD Tynslee Bowlds E. Tomasa Rand, MD 10/25/2022 8:38:08 AM This report has been signed electronically.

## 2022-10-25 NOTE — Progress Notes (Signed)
Called to room to assist during endoscopic procedure.  Patient ID and intended procedure confirmed with present staff. Received instructions for my participation in the procedure from the performing physician.  

## 2022-10-25 NOTE — Op Note (Signed)
[facility] Patient Name: [name] Procedure Date: 10/25/2022 7:54 AM MRN: [id number] Endoscopist: [name] , MD, [id number] Age: 52 Referring MD:  Date of Birth: 1971/02/21 Gender: Male Account #: [id number] Procedure:                Colonoscopy Indications:              Screening for colorectal malignant neoplasm (last                            colonoscopy was more than 10 years ago) Medicines:                Monitored Anesthesia Care Procedure:                Pre-Anesthesia Assessment:                           - Prior to the procedure, a History and Physical                            was performed, and patient medications and                            allergies were reviewed. The patient's tolerance of                            previous anesthesia was also reviewed. The risks                            and benefits of the procedure and the sedation                            options and risks were discussed with the patient.                            All questions were answered, and informed consent                            was obtained. Prior Anticoagulants: The patient has                            taken no anticoagulant or antiplatelet agents. ASA                            Grade Assessment: II - A patient with mild systemic                            disease. After reviewing the risks and benefits,                            the patient was deemed in satisfactory condition to                            undergo the procedure.  After obtaining informed consent, the colonoscope                            was passed under direct vision. Throughout the                            procedure, the patient's blood pressure, pulse, and                            oxygen saturations were monitored continuously. The                            CF [id number] #[id number] was introduced through the anus                            and advanced to  the the terminal ileum, with                            identification of the appendiceal orifice and IC                            valve. The colonoscopy was performed without                            difficulty. The patient tolerated the procedure                            well. The quality of the bowel preparation was                            good. The terminal ileum, ileocecal valve,                            appendiceal orifice, and rectum were photographed.                            The bowel preparation used was SUPREP via split                            dose instruction. Scope In: 8:14:58 AM Scope Out: 8:26:29 AM Scope Withdrawal Time: 0 hours 9 minutes 30 seconds  Total Procedure Duration: 0 hours 11 minutes 31 seconds  Findings:                 The perianal and digital rectal examinations were                            normal. Pertinent negatives include normal                            sphincter tone and no palpable rectal lesions.                           A 4 mm polyp was found in the ascending colon. The  polyp was sessile. The polyp was removed with a                            cold snare. Resection and retrieval were complete.                            Estimated blood loss was minimal.                           A single small-mouthed diverticulum was found in                            the sigmoid colon.                           The exam was otherwise normal throughout the                            examined colon.                           The terminal ileum appeared normal.                           The retroflexed view of the distal rectum and anal                            verge was normal and showed no anal or rectal                            abnormalities. Complications:            No immediate complications. Estimated Blood Loss:     Estimated blood loss was minimal. Impression:               - One 4 mm polyp in the  ascending colon, removed                            with a cold snare. Resected and retrieved.                           - Diverticulosis in the sigmoid colon.                           - The examined portion of the ileum was normal.                           - The distal rectum and anal verge are normal on                            retroflexion view. Recommendation:           - Patient has a contact number available for                            emergencies. The signs and symptoms  of potential                            delayed complications were discussed with the                            patient. Return to normal activities tomorrow.                            Written discharge instructions were provided to the                            patient.                           - Resume previous diet.                           - Continue present medications.                           - Await pathology results.                           - Repeat colonoscopy (date not yet determined) for                            surveillance based on pathology results. [name] E. Tomasa Rand, MD 10/25/2022 8:41:49 AM This report has been signed electronically.

## 2022-10-25 NOTE — Progress Notes (Signed)
Pt's states no medical or surgical changes since previsit or office visit. 

## 2022-10-29 ENCOUNTER — Telehealth: Payer: Self-pay | Admitting: *Deleted

## 2022-10-29 NOTE — Telephone Encounter (Signed)
.   Follow up Call-     10/25/2022    7:20 AM  Call back number  Post procedure Call Back phone  # [phone]  Permission to leave phone message Yes     Patient questions:  Message left to call us if necessary.

## 2022-11-05 NOTE — Progress Notes (Signed)
[name],  The polyp which I removed during your recent colonoscopy was proven to be completely benign but is considered a "pre-cancerous" polyp that MAY have grown into cancer if it had not been removed.  Studies shows that at least 20% of women over age 50 and 30% of men over age 62 have pre-cancerous polyps.  Based on current nationally recognized surveillance guidelines, I recommend that you have a repeat colonoscopy in 7 years.   If you develop any new rectal bleeding, abdominal pain or significant bowel habit changes, please contact me before then.  The biopsies of your stomach were normal.  There was no evidence of inflammation/gastritis or H. Pylori infection.  Please continue to take omeprazole for acid reflux.

## 2022-11-06 ENCOUNTER — Encounter (HOSPITAL_BASED_OUTPATIENT_CLINIC_OR_DEPARTMENT_OTHER): Payer: Self-pay

## 2023-02-03 ENCOUNTER — Encounter (HOSPITAL_BASED_OUTPATIENT_CLINIC_OR_DEPARTMENT_OTHER): Payer: Self-pay

## 2023-02-04 MED ORDER — METOPROLOL SUCCINATE ER 25 MG PO TB24: 25.0000 mg | ORAL_TABLET | Freq: Every day | ORAL | 3 refills | Status: AC

## 2023-03-14 ENCOUNTER — Ambulatory Visit: Payer: 59 | Admitting: Cardiology

## 2023-04-29 ENCOUNTER — Ambulatory Visit: Payer: 59 | Attending: Cardiology | Admitting: Cardiology

## 2023-04-29 ENCOUNTER — Encounter: Payer: Self-pay | Admitting: Cardiology

## 2023-04-29 VITALS — BP 136/96 | HR 83 | Ht 67.0 in | Wt 245.0 lb

## 2023-04-29 DIAGNOSIS — Z8249 Family history of ischemic heart disease and other diseases of the circulatory system: Secondary | ICD-10-CM | POA: Diagnosis not present

## 2023-04-29 DIAGNOSIS — R002 Palpitations: Secondary | ICD-10-CM

## 2023-04-29 DIAGNOSIS — I10 Essential (primary) hypertension: Secondary | ICD-10-CM | POA: Diagnosis not present

## 2023-04-29 NOTE — Progress Notes (Signed)
  Cardiology Office Note:  .   Date:  04/29/2023  ID:  [name], DOB 1971/02/12, MRN [id number] PCP: Blair Heys, MD (Inactive)  [facility] Providers Cardiologist:  Donato Schultz, MD    History of Present Illness: .   [name] is a 53 y.o. male Discussed the use of AI scribe software for clinical note transcription with the patient, who gave verbal consent to proceed.  History of Present Illness         53 year old here for follow-up palpitations.  Overall feeling better.  Does take Toprol 25 mg.  Does have some high heart rates at times on his Apple Watch.  No chest pain no shortness of breath.     ROS: No CP  Studies Reviewed: [employer] Songster Zio monitor-brief atrial tachycardia, rare PACs PVCs  Results         Risk Assessment/Calculations:           Physical Exam:   VS:  BP (!) 136/96   Pulse 83   Ht 5\' 7"  (1.702 m)   Wt 245 lb (111.1 kg)   SpO2 94%   BMI 38.37 kg/m    Wt Readings from Last 3 Encounters:  04/29/23 245 lb (111.1 kg)  10/25/22 236 lb (107 kg)  10/22/22 236 lb (107 kg)    GEN: Well nourished, well developed in no acute distress NECK: No JVD; No carotid bruits CARDIAC: RRR, no murmurs, no rubs, no gallops RESPIRATORY:  Clear to auscultation without rales, wheezing or rhonchi  ABDOMEN: Soft, non-tender, non-distended EXTREMITIES:  No edema; No deformity   ASSESSMENT AND PLAN: .    Assessment and Plan           Hypertension - Home readings usually less than 130/80 continue with lisinopril/hydrochlorothiazide 20/12.5 mg a day no changes made.  Appears well.  Palpitations - Okay to continue with Toprol 25 mg a day.  Seems to be doing well with this.  He did ask for a 15-day supply to get him back on track with his prescriptions.  Continue with prevention strategies.  Enjoy [place] on year anniversary.       Dispo: 1 yr APP  Signed, Donato Schultz, MD

## 2023-04-29 NOTE — Patient Instructions (Addendum)
Medication Instructions:  The current medical regimen is effective;  continue present plan and medications.  *If you need a refill on your cardiac medications before your next appointment, please call your pharmacy*  Follow-Up: At [facility], you and your health needs are our priority.  As part of our continuing mission to provide you with exceptional heart care, we have created designated Provider Care Teams.  These Care Teams include your primary Cardiologist (physician) and Advanced Practice Providers (APPs -  Physician Assistants and Nurse Practitioners) who all work together to provide you with the care you need, when you need it.  We recommend signing up for the patient portal called "MyChart".  Sign up information is provided on this After Visit Summary.  MyChart is used to connect with patients for Virtual Visits (Telemedicine).  Patients are able to view lab/test results, encounter notes, upcoming appointments, etc.  Non-urgent messages can be sent to your provider as well.   To learn more about what you can do with MyChart, go to [email or web address].    Your next appointment:   1 year(s)  Provider:   Hubbard Hartshorn

## 2023-10-24 ENCOUNTER — Other Ambulatory Visit (HOSPITAL_COMMUNITY): Payer: Self-pay | Admitting: Internal Medicine

## 2023-10-24 DIAGNOSIS — E0590 Thyrotoxicosis, unspecified without thyrotoxic crisis or storm: Secondary | ICD-10-CM

## 2023-11-11 ENCOUNTER — Encounter (HOSPITAL_COMMUNITY)
Admission: RE | Admit: 2023-11-11 | Discharge: 2023-11-11 | Disposition: A | Discharge status: Home / Self Care | Source: Ambulatory Visit | Attending: Internal Medicine | Admitting: Internal Medicine

## 2023-11-11 DIAGNOSIS — E0590 Thyrotoxicosis, unspecified without thyrotoxic crisis or storm: Secondary | ICD-10-CM | POA: Diagnosis present

## 2023-11-11 MED ORDER — SODIUM IODIDE I-123 7.4 MBQ CAPS
319.0000 | ORAL_CAPSULE | Freq: Once | ORAL | Status: AC
  Administered 2023-11-11: 319 via ORAL

## 2023-11-12 ENCOUNTER — Encounter (HOSPITAL_COMMUNITY)
Admission: RE | Admit: 2023-11-12 | Discharge: 2023-11-12 | Disposition: A | Discharge status: Home / Self Care | Source: Ambulatory Visit | Attending: Internal Medicine | Admitting: Internal Medicine

## 2023-11-14 NOTE — Progress Notes (Signed)
 Assessment/Plan:   1.  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  Patient has noted intermittent tachycardia as well.  - Patient's TSH was less than 0.01 on labs in July.  I do not have any other further labs, but I do see notes from primary care that they were going to follow-up on this.  Patient reports he is now seeing endocrinology and following up in that regard.  Hyperthyroidism certainly can be a source of tremor.  - Discussed with patient that if he continues to have tremor once hyperthyroidism has been adequately treated (he knows that this may be many months), we can try something like propranolol to use on an as-needed basis if tremor is bothersome.  - Reassured patient I saw nothing neurodegenerative on his examination today.  - Follow-up with me will be prn  Subjective:   [name] was seen today in the movement disorders clinic for neurologic consultation at the request of Auston Opal, DO.  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  Referring notes state that patient has had ongoing but progressive shaking and difficult coordination and tasks with manual use of right hand/arm.  Patient recently had very abnormal TSH of less than 0.01.  He subsequently had nuclear medicine thyroid  scan that was done August 26 and that has not been read yet.  He does tell me he knew about a year ago when worked up tachycardia that his tsh was on the lower end of normal and he is see is seeing endocrinology now, Dr. Braulio.    Tremor: Yes.     How [place] has it been going on? 1+ year, intermittent, started 1 time every few months and wife would notice it first and would notice it with precise motions (putting toothpaste on toothbrush; retrieving ice from ice machine); tremor is still pretty sporadic/intermittent but maybe more than 1 time every few months  At rest or with activation?  activation  Fam hx of tremor?   paternal uncle and possibly father (father had tremor but he died before the dx was made)  Located where?  Unknown if both hands, but he notes it more in the R hand since he is R handed  Affected by caffeine:  No. (Drinks iced )  Affected by alcohol:  doesn't drink alcohol  Affected by stress:  no, but also not under a lot of stress   Affected by fatigue:  perhaps  Spills soup if on spoon:  tremor is intermittent so not usually an issue and food stays on the utensil   Other Specific Symptoms:  Voice: no change Postural symptoms:  No.  Falls?  No. Bradykinesia symptoms: no bradykinesia noted Loss of smell:  No. Loss of taste:  No. Urinary Incontinence:  No. Difficulty Swallowing:  No. N/V:  No. Lightheaded:  No.  Syncope: No. Diplopia:  No. Dyskinesia:  No.  Neuroimaging of the brain has not previously been performed.    ALLERGIES:   Allergies  Allergen Reactions   Hydromorphone Itching    CURRENT MEDICATIONS:  Current Outpatient Medications  Medication Instructions   Bacillus Coagulans-Inulin (PROBIOTIC) 1-250 BILLION-MG CAPS    Lactobacillus (PROBIOTIC ACIDOPHILUS PO) Take by mouth.   lisinopril-hydrochlorothiazide (ZESTORETIC) 20-12.5 MG tablet 1 tablet, Daily   metoprolol  succinate (TOPROL  XL) 25 mg, Oral, Daily   Multiple Vitamins-Minerals (MULTIVITAMIN WITH MINERALS) tablet 1 tablet, Daily   omeprazole (PRILOSEC OTC) 20 mg, Daily  Objective:   PHYSICAL EXAMINATION:    VITALS:   Vitals:   11/18/23 0956  BP: 124/86  Pulse: 94  SpO2: 96%  Weight: 243 lb 3.2 oz (110.3 kg)  Height: 5' 7 (1.702 m)    GEN:  The patient appears stated age and is in NAD. HEENT:  Normocephalic, atraumatic.  The mucous membranes are moist. The superficial temporal arteries are without ropiness or tenderness. CV:  RRR Lungs:  CTAB Neck/HEME:  There are no carotid bruits bilaterally.  Neurological examination:  Orientation: The patient is alert and oriented x3.  Cranial  nerves: There is good facial symmetry.  Extraocular muscles are intact. The visual fields are full to confrontational testing. The speech is fluent and clear. Soft palate rises symmetrically and there is no tongue deviation. Hearing is intact to conversational tone. Sensation: Sensation is intact to light touch throughout (facial, trunk, extremities). Vibration is intact at the bilateral big toe. There is no extinction with double simultaneous stimulation.  Motor: Strength is 5/5 in the bilateral upper and lower extremities.   Shoulder shrug is equal and symmetric.  There is no pronator drift. Deep tendon reflexes: Deep tendon reflexes are 2/4 at the bilateral biceps, triceps, brachioradialis, patella and achilles. Plantar responses are downgoing bilaterally.  Movement examination: Tone: There is nl tone in the bilateral upper extremities.  The tone in the lower extremities is nl.  Abnormal movements: there is no rest tremor.  There is mild postural tremor, low amplitude/high frequency, R>L.  There is mild intention tremor.  There is mild tremor when given a weight to hold.  When pouring water from 1 glass to another, he does not spill the water, but when the water is in the right hand, there is some tremor noted.  He does better with the water in the left hand.  Minimal tremor with Archimedes spirals. Coordination:  There is no decremation with RAM's, with any form of RAMS, including alternating supination and pronation of the forearm, hand opening and closing, finger taps, heel taps and toe taps.  Gait and Station: The patient has no difficulty arising out of a deep-seated chair without the use of the hands. The patient's stride length is good.  He is able to ambulate in a heel-toe fashion.  He is able to stand in the Romberg position. I have reviewed and interpreted the following labs independently   Chemistry      Component Value Date/Time   NA 142 10/22/2022 1449   K 4.0 10/22/2022 1449   CL 107  10/22/2022 1449   CO2 25 10/22/2022 1449   BUN 17 10/22/2022 1449   CREATININE 1.01 10/22/2022 1449      Component Value Date/Time   CALCIUM 10.0 10/22/2022 1449   ALKPHOS 53 10/22/2022 1449   AST 17 10/22/2022 1449   ALT 26 10/22/2022 1449   BILITOT 1.0 10/22/2022 1449      Lab Results  Component Value Date   TSH 0.509 07/19/2022   Lab Results  Component Value Date   WBC 8.5 10/22/2022   HGB 15.0 10/22/2022   HCT 44.6 10/22/2022   MCV 92.5 10/22/2022   PLT 213.0 10/22/2022   Patient had more recent labs with primary care on October 07, 2023.  Sodium was 139, potassium 4.1, chloride 105, CO2 26, BUN 16, creatinine 0.83, glucose 107.  AST 22, ALT 46.  Hemoglobin A1c 5.5.  Patient's TSH was incredibly low at 0.01.   Total time spent on today's visit was 45  minutes, including both face-to-face time and nonface-to-face time.  Time included that spent on review of records (prior notes available to me/labs/imaging if pertinent), discussing treatment and goals, answering patient's questions and coordinating care.  Cc:  Auston Opal, DO

## 2023-11-18 ENCOUNTER — Encounter: Payer: Self-pay | Admitting: Neurology

## 2023-11-18 ENCOUNTER — Ambulatory Visit: Admitting: Neurology

## 2023-11-18 VITALS — BP 124/86 | HR 94 | Ht 67.0 in | Wt 243.2 lb

## 2023-11-18 DIAGNOSIS — R251 Tremor, unspecified: Secondary | ICD-10-CM | POA: Diagnosis not present

## 2023-11-18 DIAGNOSIS — E0590 Thyrotoxicosis, unspecified without thyrotoxic crisis or storm: Secondary | ICD-10-CM

## 2023-11-18 DIAGNOSIS — E889 Metabolic disorder, unspecified: Secondary | ICD-10-CM

## 2023-11-18 NOTE — Patient Instructions (Signed)
 Good to see you!  If you continue to have tremor once thyroid  is adequately treated, you can let us  know (especially if you decide you need treatment)  The physicians and staff at [facility] are committed to providing excellent care. You may receive a survey requesting feedback about your experience at our office. We strive to receive very good responses to the survey questions. If you feel that your experience would prevent you from giving the office a very good  response, please contact our office to try to remedy the situation. We may be reached at [phone]. Thank you for taking the time out of your busy day to complete the survey.

## 2024-04-17 ENCOUNTER — Other Ambulatory Visit: Payer: Self-pay

## 2024-04-17 ENCOUNTER — Emergency Department (HOSPITAL_BASED_OUTPATIENT_CLINIC_OR_DEPARTMENT_OTHER)

## 2024-04-17 ENCOUNTER — Emergency Department (HOSPITAL_BASED_OUTPATIENT_CLINIC_OR_DEPARTMENT_OTHER)
Admission: EM | Admit: 2024-04-17 | Discharge: 2024-04-17 | Disposition: A | Discharge status: Home / Self Care | Attending: Emergency Medicine | Admitting: Emergency Medicine

## 2024-04-17 ENCOUNTER — Other Ambulatory Visit (HOSPITAL_BASED_OUTPATIENT_CLINIC_OR_DEPARTMENT_OTHER): Payer: Self-pay

## 2024-04-17 DIAGNOSIS — I10 Essential (primary) hypertension: Secondary | ICD-10-CM | POA: Insufficient documentation

## 2024-04-17 DIAGNOSIS — N200 Calculus of kidney: Secondary | ICD-10-CM

## 2024-04-17 DIAGNOSIS — Z79899 Other long term (current) drug therapy: Secondary | ICD-10-CM | POA: Insufficient documentation

## 2024-04-17 DIAGNOSIS — N132 Hydronephrosis with renal and ureteral calculous obstruction: Secondary | ICD-10-CM | POA: Diagnosis not present

## 2024-04-17 DIAGNOSIS — R109 Unspecified abdominal pain: Secondary | ICD-10-CM | POA: Diagnosis present

## 2024-04-17 LAB — COMPREHENSIVE METABOLIC PANEL WITH GFR
ALT: 33 U/L (ref 0–44)
AST: 22 U/L (ref 15–41)
Albumin: 4.6 g/dL (ref 3.5–5.0)
Alkaline Phosphatase: 74 U/L (ref 38–126)
Anion gap: 15 (ref 5–15)
BUN: 13 mg/dL (ref 6–20)
CO2: 23 mmol/L (ref 22–32)
Calcium: 9.8 mg/dL (ref 8.9–10.3)
Chloride: 99 mmol/L (ref 98–111)
Creatinine, Ser: 1.03 mg/dL (ref 0.61–1.24)
GFR, Estimated: >60 mL/min
Glucose, Bld: 140 mg/dL — ABNORMAL HIGH (ref 70–99)
Potassium: 3.8 mmol/L (ref 3.5–5.1)
Sodium: 137 mmol/L (ref 135–145)
Total Bilirubin: 1.6 mg/dL — ABNORMAL HIGH (ref 0.0–1.2)
Total Protein: 7.5 g/dL (ref 6.5–8.1)

## 2024-04-17 LAB — URINALYSIS, ROUTINE W REFLEX MICROSCOPIC
Bacteria, UA: NONE SEEN
Bilirubin Urine: NEGATIVE
Glucose, UA: NEGATIVE mg/dL
Leukocytes,Ua: NEGATIVE
Nitrite: NEGATIVE
Protein, ur: 30 mg/dL — AB
RBC / HPF: >50 RBC/hpf (ref 0–5)
Specific Gravity, Urine: 1.041 — ABNORMAL HIGH (ref 1.005–1.030)
pH: 5.5 (ref 5.0–8.0)

## 2024-04-17 LAB — CBC WITH DIFFERENTIAL/PLATELET
Abs Immature Granulocytes: 0.02 10*3/uL (ref 0.00–0.07)
Basophils Absolute: 0.1 10*3/uL (ref 0.0–0.1)
Basophils Relative: 1 %
Eosinophils Absolute: 0.2 10*3/uL (ref 0.0–0.5)
Eosinophils Relative: 2 %
HCT: 45 % (ref 39.0–52.0)
Hemoglobin: 15.7 g/dL (ref 13.0–17.0)
Immature Granulocytes: 0 %
Lymphocytes Relative: 28 %
Lymphs Abs: 2 10*3/uL (ref 0.7–4.0)
MCH: 32 pg (ref 26.0–34.0)
MCHC: 34.9 g/dL (ref 30.0–36.0)
MCV: 91.6 fL (ref 80.0–100.0)
Monocytes Absolute: 0.5 10*3/uL (ref 0.1–1.0)
Monocytes Relative: 7 %
Neutro Abs: 4.5 10*3/uL (ref 1.7–7.7)
Neutrophils Relative %: 62 %
Platelets: 232 10*3/uL (ref 150–400)
RBC: 4.91 MIL/uL (ref 4.22–5.81)
RDW: 13.1 % (ref 11.5–15.5)
WBC: 7.3 10*3/uL (ref 4.0–10.5)
nRBC: 0 % (ref 0.0–0.2)

## 2024-04-17 LAB — LIPASE, BLOOD: Lipase: 28 U/L (ref 11–51)

## 2024-04-17 MED ORDER — KETOROLAC TROMETHAMINE 15 MG/ML IJ SOLN
15.0000 mg | Freq: Once | INTRAMUSCULAR | Status: AC
  Administered 2024-04-17: 15 mg via INTRAVENOUS
  Filled 2024-04-17: qty 1

## 2024-04-17 MED ORDER — MORPHINE SULFATE (PF) 4 MG/ML IV SOLN
4.0000 mg | Freq: Once | INTRAVENOUS | Status: AC
  Administered 2024-04-17: 4 mg via INTRAVENOUS
  Filled 2024-04-17: qty 1

## 2024-04-17 MED ORDER — FENTANYL CITRATE (PF) 50 MCG/ML IJ SOSY
50.0000 ug | PREFILLED_SYRINGE | Freq: Once | INTRAMUSCULAR | Status: AC
  Administered 2024-04-17: 50 ug via INTRAVENOUS
  Filled 2024-04-17: qty 1

## 2024-04-17 MED ORDER — TAMSULOSIN HCL 0.4 MG PO CAPS
0.4000 mg | ORAL_CAPSULE | Freq: Every day | ORAL | 0 refills | Status: AC
  Filled 2024-04-17: qty 30, 30d supply, fill #0

## 2024-04-17 MED ORDER — ONDANSETRON HCL 4 MG PO TABS
4.0000 mg | ORAL_TABLET | Freq: Three times a day (TID) | ORAL | 0 refills | Status: AC | PRN
  Filled 2024-04-17: qty 20, 7d supply, fill #0

## 2024-04-17 MED ORDER — ONDANSETRON HCL 4 MG/2ML IJ SOLN
4.0000 mg | Freq: Once | INTRAMUSCULAR | Status: AC
  Administered 2024-04-17: 4 mg via INTRAVENOUS
  Filled 2024-04-17: qty 2

## 2024-04-17 MED ORDER — NAPROXEN 500 MG PO TABS
500.0000 mg | ORAL_TABLET | Freq: Two times a day (BID) | ORAL | 0 refills | Status: AC
  Filled 2024-04-17: qty 30, 15d supply, fill #0

## 2024-04-17 MED ORDER — OXYCODONE HCL 5 MG PO TABS
5.0000 mg | ORAL_TABLET | Freq: Four times a day (QID) | ORAL | 0 refills | Status: AC | PRN
  Filled 2024-04-17: qty 10, 3d supply, fill #0

## 2024-04-17 NOTE — ED Notes (Signed)
 Patient transported to CT

## 2024-04-17 NOTE — ED Triage Notes (Signed)
 Pt has had hx kidney stones. Awoke today with severe right flank pain. No fevers. Having difficulty voiding.

## 2024-04-17 NOTE — Discharge Instructions (Addendum)
 CT scan shows kidney stone. No sign of infection. You have normal kidney function.   Please take 1000mg  of tylenol (acetaminophen) every 8 hours, as needed for pain. You can also take up to 800mg  of Ibuprofen (NSAID) every 8 hours OR 500mg  of Naproxen  (NSAID) every 12 hours with food, as needed for pain. Take Flomax , once daily.  Take zofran  as needed for nausea and vomiting.   You can take Oxycodone  as needed for severe or breakthrough pain.   Call Urology for follow up appointment and return to ED with new or worsening symptoms.

## 2024-04-17 NOTE — ED Provider Notes (Signed)
 " [name]   CSN: [id number] Arrival date & time: 04/17/24  9055     Patient presents with: Flank Pain   [name] is a 54 y.o. male.  Patient with past medical history of hypertension, hyperlipidemia, kidney stone presents to emergency room with complaint of right-sided flank pain.  Patient reports that he woke up this morning with right sided flank pain that is associated with nausea and some urinary urgency.  Patient reports he has a history of kidney stones and that this feels very similar.  He has not taken any medicines prior to arrival.  He denies any urinary frequency or dysuria.  No fever.  No vomiting or diarrhea.  Denies chest pain shortness of breath or cough.    Flank Pain       Prior to Admission medications  Medication Sig Start Date End Date Taking? Authorizing Provider  Bacillus Coagulans-Inulin (PROBIOTIC) 1-250 BILLION-MG CAPS  08/31/22   [provider]  Lactobacillus (PROBIOTIC ACIDOPHILUS PO) Take by mouth.    [provider]  lisinopril-hydrochlorothiazide (ZESTORETIC) 20-12.5 MG tablet Take 1 tablet by mouth daily.    [provider]  metoprolol  succinate (TOPROL  XL) 25 MG 24 hr tablet Take 1 tablet (25 mg total) by mouth daily. 02/04/23   Vannie Reche RAMAN, NP  Multiple Vitamins-Minerals (MULTIVITAMIN WITH MINERALS) tablet Take 1 tablet by mouth daily.    [provider]  omeprazole (PRILOSEC OTC) 20 MG tablet Take 20 mg by mouth daily. 08/19/22   [provider]    Allergies: Hydromorphone    Review of Systems  Genitourinary:  Positive for flank pain.    Updated Vital Signs BP (!) 163/108   Pulse 70   Temp (!) 97.5 F (36.4 C) (Oral)   Resp 14   Ht 5' 7 (1.702 m)   Wt 106.6 kg   SpO2 98%   BMI 36.81 kg/m   Physical Exam Vitals and nursing Lawrence reviewed.  Constitutional:      General: He is not in acute distress.    Appearance: He  is not toxic-appearing.  HENT:     Head: Normocephalic and atraumatic.  Eyes:     General: No scleral icterus.    Conjunctiva/sclera: Conjunctivae normal.  Cardiovascular:     Rate and Rhythm: Normal rate and regular rhythm.     Pulses: Normal pulses.     Heart sounds: Normal heart sounds.  Pulmonary:     Effort: Pulmonary effort is normal. No respiratory distress.     Breath sounds: Normal breath sounds.  Abdominal:     General: Abdomen is flat. Bowel sounds are normal.     Palpations: Abdomen is soft.     Tenderness: There is no abdominal tenderness. There is right CVA tenderness. There is no left CVA tenderness.     Comments: Mild right sided CVA tenderness. No abdominal TTP.   Musculoskeletal:     Comments: No loss of bowel or bladder function.  No saddle anesthesia.   Skin:    General: Skin is warm and dry.     Findings: No lesion.  Neurological:     General: No focal deficit present.     Mental Status: He is alert and oriented to person, place, and time. Mental status is at baseline.     (all labs ordered are listed, but only abnormal results are displayed) Labs Reviewed  COMPREHENSIVE METABOLIC PANEL WITH GFR - Abnormal; Notable for the  following components:      Result Value   Glucose, Bld 140 (*)    Total Bilirubin 1.6 (*)    All other components within normal limits  URINALYSIS, ROUTINE W REFLEX MICROSCOPIC - Abnormal; Notable for the following components:   APPearance HAZY (*)    Specific Gravity, Urine 1.041 (*)    Hgb urine dipstick LARGE (*)    Ketones, ur TRACE (*)    Protein, ur 30 (*)    All other components within normal limits  LIPASE, BLOOD  CBC WITH DIFFERENTIAL/PLATELET    EKG: None  Radiology: CT RENAL STONE STUDY Result Date: 04/17/2024 CLINICAL DATA:  Abdominal pain. EXAM: CT ABDOMEN AND PELVIS WITHOUT CONTRAST TECHNIQUE: Multidetector CT imaging of the abdomen and pelvis was performed following the standard protocol without IV contrast.  RADIATION DOSE REDUCTION: This exam was performed according to the departmental dose-optimization program which includes automated exposure control, adjustment of the mA and/or kV according to patient size and/or use of iterative reconstruction technique. COMPARISON:  Aug 08, 2022 FINDINGS: Lower chest: No acute abnormality. Hepatobiliary: No focal liver abnormality is seen. Numerous subcentimeter gallstones are seen within the gallbladder lumen without evidence of gallbladder wall thickening or biliary dilatation. Pancreas: Unremarkable. No pancreatic ductal dilatation or surrounding inflammatory changes. Spleen: Normal in size without focal abnormality. Adrenals/Urinary Tract: Adrenal glands are unremarkable. Kidneys are normal in size, without focal lesions. A 2 mm obstructing renal calculus is seen within the distal right ureter, with mild right-sided hydronephrosis, hydroureter and perinephric inflammatory fat stranding. Bladder is unremarkable. Stomach/Bowel: Stomach is within normal limits. Appendix appears normal. No evidence of bowel wall thickening, distention, or inflammatory changes. A solitary noninflamed diverticulum is seen within the proximal sigmoid colon. Vascular/Lymphatic: No significant vascular findings are present. No enlarged abdominal or pelvic lymph nodes. Reproductive: Prostate is unremarkable. Other: There is a small, stable fat containing umbilical hernia. No abdominopelvic ascites. Musculoskeletal: No acute or significant osseous findings. IMPRESSION: 1. 2 mm obstructing renal calculus within the distal right ureter. 2. Cholelithiasis. Electronically Signed   By: Suzen Dials M.D.   On: 04/17/2024 11:24     Procedures   Medications Ordered in the ED  ondansetron  (ZOFRAN ) injection 4 mg (4 mg Intravenous Given 04/17/24 1006)  morphine  (PF) 4 MG/ML injection 4 mg (4 mg Intravenous Given 04/17/24 1006)  fentaNYL  (SUBLIMAZE ) injection 50 mcg (50 mcg Intravenous Given 04/17/24  1026)  ketorolac  (TORADOL ) 15 MG/ML injection 15 mg (15 mg Intravenous Given 04/17/24 1053)                                    Medical Decision Making Amount and/or Complexity of Data Reviewed Labs: ordered. Radiology: ordered.  Risk Prescription drug management.   This patient presents to the ED for concern of abdominal pain, this involves an extensive number of treatment options, and is a complaint that carries with it a high risk of complications and morbidity.  The differential diagnosis includes cholecystitis, AAA, appendicitis, renal stone, UTI   Co morbidities that complicate the patient evaluation  HTN, HLD, Hx of kidney stone    Additional history obtained:  Additional history obtained from CT scan of abd/pelvis 07/2022 no evidence of stone    Lab Tests:  I personally interpreted labs.  The pertinent results include:   CBC, CMP, Lipase UA large Hgb, over 50 RBCs with negative nitrites, leukocytes and no bacteria   Imaging  Studies ordered:  I ordered imaging studies including Renal Study as this is favored pathology at this time  I independently visualized and interpreted imaging which showed 2 mm right renal stone I agree with the radiologist interpretation   Cardiac Monitoring: / EKG:  The patient was maintained on a cardiac monitor.     Problem List / ED Course / Critical interventions / Medication management  Patient presenting with one day of flank pain. On arrival hemodynamically stable and well appearing. No fever, also denies any infectious symptoms at this time. Denies injury, trauma or fall with this. No red flag symptoms associated with back pain like history of IVDU, fever or symptoms concerning for claudia equina.  On exam patient has no focal area of abdominal tenderness, no rebound tenderness.  He does have right sided CVA tenderness.  No lumbar midline tenderness, step-off or deformity.  Symptoms do seem less concerning for kidney stone at this  time.  I have added labs, urine and renal study.  Given antiemetic and pain control.  Will reassess after treatment and workup.  Patient was found to have a 2 mm right kidney stone which is consistent with patient's symptoms. I ordered medication including Morphine , Zofran   Reevaluation of the patient after these medicines showed that the patient improved I have reviewed the patients home medicines and have made adjustments as needed. Patient has been hemodynamically stable throughout stay.  He is afebrile and no sign of urinary tract infection.  He has normal kidney function.  Patient passed p.o. challenge and pain is well-controlled.  Will give urology follow-up.  Feel appropriate for discharge with close outpatient follow-up.  Given return cautions.      Final diagnoses:  Kidney stone on right side    ED Discharge Orders          Ordered    ondansetron  (ZOFRAN -ODT) 4 MG disintegrating tablet  Every 8 hours PRN        04/17/24 1131    oxyCODONE  (ROXICODONE ) 5 MG immediate release tablet  Every 6 hours PRN        04/17/24 1131    tamsulosin  (FLOMAX ) 0.4 MG CAPS capsule  Daily after supper        04/17/24 1131    naproxen  (NAPROSYN ) 500 MG tablet  2 times daily        04/17/24 1131               [name], Warren SAILOR, PA-C 04/17/24 1138    Gabriel Ozell BROCKS, MD 04/17/24 1619  "
